# Patient Record
Sex: Male | Born: 1960 | Race: White | Hispanic: No | Marital: Married | Smoking: Current every day smoker
Health system: Southern US, Community
[De-identification: ages and names within clinical notes are randomized; demographics above are authoritative.]

## PROBLEM LIST (undated history)

## (undated) DIAGNOSIS — J449 Chronic obstructive pulmonary disease, unspecified: Secondary | ICD-10-CM

## (undated) DIAGNOSIS — I10 Essential (primary) hypertension: Secondary | ICD-10-CM

## (undated) DIAGNOSIS — N289 Disorder of kidney and ureter, unspecified: Secondary | ICD-10-CM

## (undated) DIAGNOSIS — E119 Type 2 diabetes mellitus without complications: Secondary | ICD-10-CM

## (undated) DIAGNOSIS — C801 Malignant (primary) neoplasm, unspecified: Secondary | ICD-10-CM

---

## 2018-05-24 ENCOUNTER — Encounter
Admission: EM | Disposition: A | Discharge status: Home / Self Care | Payer: Self-pay | Source: Home / Self Care | Attending: Internal Medicine

## 2018-05-24 ENCOUNTER — Emergency Department: Payer: BLUE CROSS/BLUE SHIELD

## 2018-05-24 ENCOUNTER — Observation Stay (HOSPITAL_BASED_OUTPATIENT_CLINIC_OR_DEPARTMENT_OTHER)
Admit: 2018-05-24 | Discharge: 2018-05-24 | Disposition: A | Discharge status: Home / Self Care | Payer: BLUE CROSS/BLUE SHIELD | Attending: Internal Medicine | Admitting: Internal Medicine

## 2018-05-24 ENCOUNTER — Other Ambulatory Visit: Payer: Self-pay

## 2018-05-24 ENCOUNTER — Observation Stay
Admission: EM | Admit: 2018-05-24 | Discharge: 2018-05-25 | Disposition: A | Discharge status: Home / Self Care | Payer: BLUE CROSS/BLUE SHIELD | Attending: Internal Medicine | Admitting: Internal Medicine

## 2018-05-24 DIAGNOSIS — R739 Hyperglycemia, unspecified: Secondary | ICD-10-CM

## 2018-05-24 DIAGNOSIS — K219 Gastro-esophageal reflux disease without esophagitis: Secondary | ICD-10-CM | POA: Diagnosis not present

## 2018-05-24 DIAGNOSIS — E1165 Type 2 diabetes mellitus with hyperglycemia: Secondary | ICD-10-CM | POA: Insufficient documentation

## 2018-05-24 DIAGNOSIS — Z85828 Personal history of other malignant neoplasm of skin: Secondary | ICD-10-CM | POA: Diagnosis not present

## 2018-05-24 DIAGNOSIS — E1122 Type 2 diabetes mellitus with diabetic chronic kidney disease: Secondary | ICD-10-CM | POA: Insufficient documentation

## 2018-05-24 DIAGNOSIS — J441 Chronic obstructive pulmonary disease with (acute) exacerbation: Secondary | ICD-10-CM | POA: Diagnosis present

## 2018-05-24 DIAGNOSIS — Z794 Long term (current) use of insulin: Secondary | ICD-10-CM | POA: Diagnosis not present

## 2018-05-24 DIAGNOSIS — I130 Hypertensive heart and chronic kidney disease with heart failure and stage 1 through stage 4 chronic kidney disease, or unspecified chronic kidney disease: Secondary | ICD-10-CM | POA: Diagnosis not present

## 2018-05-24 DIAGNOSIS — Z8249 Family history of ischemic heart disease and other diseases of the circulatory system: Secondary | ICD-10-CM | POA: Diagnosis not present

## 2018-05-24 DIAGNOSIS — I5030 Unspecified diastolic (congestive) heart failure: Secondary | ICD-10-CM

## 2018-05-24 DIAGNOSIS — Z9114 Patient's other noncompliance with medication regimen: Secondary | ICD-10-CM | POA: Diagnosis not present

## 2018-05-24 DIAGNOSIS — F17200 Nicotine dependence, unspecified, uncomplicated: Secondary | ICD-10-CM | POA: Insufficient documentation

## 2018-05-24 DIAGNOSIS — R0789 Other chest pain: Secondary | ICD-10-CM | POA: Insufficient documentation

## 2018-05-24 DIAGNOSIS — Z79899 Other long term (current) drug therapy: Secondary | ICD-10-CM | POA: Diagnosis not present

## 2018-05-24 DIAGNOSIS — N189 Chronic kidney disease, unspecified: Secondary | ICD-10-CM | POA: Insufficient documentation

## 2018-05-24 DIAGNOSIS — Z888 Allergy status to other drugs, medicaments and biological substances status: Secondary | ICD-10-CM | POA: Diagnosis not present

## 2018-05-24 DIAGNOSIS — Z91013 Allergy to seafood: Secondary | ICD-10-CM | POA: Insufficient documentation

## 2018-05-24 DIAGNOSIS — Z87442 Personal history of urinary calculi: Secondary | ICD-10-CM | POA: Diagnosis not present

## 2018-05-24 DIAGNOSIS — R079 Chest pain, unspecified: Secondary | ICD-10-CM

## 2018-05-24 DIAGNOSIS — E119 Type 2 diabetes mellitus without complications: Secondary | ICD-10-CM

## 2018-05-24 HISTORY — DX: Chronic obstructive pulmonary disease, unspecified: J44.9

## 2018-05-24 HISTORY — DX: Disorder of kidney and ureter, unspecified: N28.9

## 2018-05-24 HISTORY — DX: Type 2 diabetes mellitus without complications: E11.9

## 2018-05-24 HISTORY — PX: LEFT HEART CATH AND CORONARY ANGIOGRAPHY: CATH118249

## 2018-05-24 HISTORY — DX: Essential (primary) hypertension: I10

## 2018-05-24 HISTORY — DX: Malignant (primary) neoplasm, unspecified: C80.1

## 2018-05-24 LAB — COMPREHENSIVE METABOLIC PANEL
ALK PHOS: 119 U/L (ref 38–126)
ALT: 26 U/L (ref 0–44)
ANION GAP: 10 (ref 5–15)
AST: 22 U/L (ref 15–41)
Albumin: 3.6 g/dL (ref 3.5–5.0)
BILIRUBIN TOTAL: 0.6 mg/dL (ref 0.3–1.2)
BUN: 20 mg/dL (ref 6–20)
CALCIUM: 8.9 mg/dL (ref 8.9–10.3)
CO2: 22 mmol/L (ref 22–32)
CREATININE: 1.29 mg/dL — AB (ref 0.61–1.24)
Chloride: 97 mmol/L — ABNORMAL LOW (ref 98–111)
GFR calc Af Amer: >60 mL/min (ref >60)
GFR calc non Af Amer: >60 mL/min (ref >60)
GLUCOSE: 539 mg/dL — AB (ref 70–99)
Potassium: 3.8 mmol/L (ref 3.5–5.1)
SODIUM: 129 mmol/L — AB (ref 135–145)
TOTAL PROTEIN: 6.8 g/dL (ref 6.5–8.1)

## 2018-05-24 LAB — TROPONIN I
Troponin I: <0.03 ng/mL (ref <0.03)
Troponin I: 0.1 ng/mL (ref <0.03)
Troponin I: 0.48 ng/mL (ref <0.03)
Troponin I: 0.94 ng/mL (ref <0.03)

## 2018-05-24 LAB — GLUCOSE, CAPILLARY
Glucose-Capillary: 321 mg/dL — ABNORMAL HIGH (ref 70–99)
Glucose-Capillary: 417 mg/dL — ABNORMAL HIGH (ref 70–99)
Glucose-Capillary: 418 mg/dL — ABNORMAL HIGH (ref 70–99)
Glucose-Capillary: 330 mg/dL — ABNORMAL HIGH (ref 70–99)
Glucose-Capillary: 325 mg/dL — ABNORMAL HIGH (ref 70–99)
Glucose-Capillary: 509 mg/dL (ref 70–99)
Glucose-Capillary: 475 mg/dL — ABNORMAL HIGH (ref 70–99)

## 2018-05-24 LAB — CBC WITH DIFFERENTIAL/PLATELET
Abs Immature Granulocytes: 0.04 10*3/uL (ref 0.00–0.07)
Basophils Absolute: 0 10*3/uL (ref 0.0–0.1)
Basophils Relative: 0 %
EOS PCT: 2 %
Eosinophils Absolute: 0.2 10*3/uL (ref 0.0–0.5)
HEMATOCRIT: 43 % (ref 39.0–52.0)
Hemoglobin: 15.2 g/dL (ref 13.0–17.0)
Immature Granulocytes: 1 %
Lymphocytes Relative: 29 %
Lymphs Abs: 2.2 10*3/uL (ref 0.7–4.0)
MCH: 31.7 pg (ref 26.0–34.0)
MCHC: 35.3 g/dL (ref 30.0–36.0)
MCV: 89.6 fL (ref 80.0–100.0)
MONO ABS: 0.4 10*3/uL (ref 0.1–1.0)
MONOS PCT: 5 %
NRBC: 0 % (ref 0.0–0.2)
Neutro Abs: 4.8 10*3/uL (ref 1.7–7.7)
Neutrophils Relative %: 63 %
Platelets: 134 10*3/uL — ABNORMAL LOW (ref 150–400)
RBC: 4.8 MIL/uL (ref 4.22–5.81)
RDW: 13.1 % (ref 11.5–15.5)
WBC: 7.6 10*3/uL (ref 4.0–10.5)

## 2018-05-24 LAB — ECHOCARDIOGRAM COMPLETE
Height: 72 in
WEIGHTICAEL: 3712 [oz_av]

## 2018-05-24 LAB — PROTIME-INR
INR: 0.94
PROTHROMBIN TIME: 12.5 s (ref 11.4–15.2)

## 2018-05-24 LAB — TSH: TSH: 0.757 u[IU]/mL (ref 0.350–4.500)

## 2018-05-24 LAB — HEMOGLOBIN A1C
Hgb A1c MFr Bld: 12 % — ABNORMAL HIGH (ref 4.8–5.6)
Mean Plasma Glucose: 297.7 mg/dL

## 2018-05-24 LAB — APTT: APTT: 27 s (ref 24–36)

## 2018-05-24 LAB — BRAIN NATRIURETIC PEPTIDE: B Natriuretic Peptide: 11 pg/mL (ref 0.0–100.0)

## 2018-05-24 LAB — HEPARIN LEVEL (UNFRACTIONATED): Heparin Unfractionated: <0.1 IU/mL — ABNORMAL LOW (ref 0.30–0.70)

## 2018-05-24 SURGERY — LEFT HEART CATH AND CORONARY ANGIOGRAPHY
Anesthesia: Moderate Sedation

## 2018-05-24 MED ORDER — SODIUM CHLORIDE 0.9 % IV SOLN: 250.0000 mL | INTRAVENOUS | Status: DC | PRN

## 2018-05-24 MED ORDER — DIPHENHYDRAMINE HCL 50 MG/ML IJ SOLN
12.5000 mg | Freq: Once | INTRAMUSCULAR | Status: AC
  Administered 2018-05-24: 12.5 mg via INTRAVENOUS
  Filled 2018-05-24: qty 1

## 2018-05-24 MED ORDER — SUCRALFATE 1 GM/10ML PO SUSP
1.0000 g | Freq: Three times a day (TID) | ORAL | Status: DC
  Administered 2018-05-24 – 2018-05-25 (×2): 1 g via ORAL
  Filled 2018-05-24 (×5): qty 10

## 2018-05-24 MED ORDER — ALBUTEROL SULFATE (2.5 MG/3ML) 0.083% IN NEBU: 2.5000 mg | INHALATION_SOLUTION | RESPIRATORY_TRACT | Status: DC | PRN

## 2018-05-24 MED ORDER — SERTRALINE HCL 50 MG PO TABS
50.0000 mg | ORAL_TABLET | Freq: Every day | ORAL | Status: DC
  Administered 2018-05-24 – 2018-05-25 (×2): 50 mg via ORAL
  Filled 2018-05-24 (×2): qty 1

## 2018-05-24 MED ORDER — HEPARIN BOLUS VIA INFUSION
4000.0000 [IU] | Freq: Once | INTRAVENOUS | Status: AC
  Administered 2018-05-24: 4000 [IU] via INTRAVENOUS
  Filled 2018-05-24: qty 4000

## 2018-05-24 MED ORDER — METHYLPREDNISOLONE SODIUM SUCC 125 MG IJ SOLR
INTRAMUSCULAR | Status: AC
  Filled 2018-05-24: qty 2

## 2018-05-24 MED ORDER — ASPIRIN 81 MG PO CHEW
CHEWABLE_TABLET | ORAL | Status: AC
  Filled 2018-05-24: qty 1

## 2018-05-24 MED ORDER — GABAPENTIN 300 MG PO CAPS: 600.0000 mg | ORAL_CAPSULE | Freq: Three times a day (TID) | ORAL | Status: DC

## 2018-05-24 MED ORDER — ACETAMINOPHEN 325 MG PO TABS: 650.0000 mg | ORAL_TABLET | Freq: Four times a day (QID) | ORAL | Status: DC | PRN

## 2018-05-24 MED ORDER — GABAPENTIN 300 MG PO CAPS: 900.0000 mg | ORAL_CAPSULE | Freq: Every day | ORAL | Status: DC

## 2018-05-24 MED ORDER — FENTANYL CITRATE (PF) 100 MCG/2ML IJ SOLN
INTRAMUSCULAR | Status: DC | PRN
  Administered 2018-05-24: 25 ug via INTRAVENOUS

## 2018-05-24 MED ORDER — METHYLPREDNISOLONE SODIUM SUCC 125 MG IJ SOLR
125.0000 mg | Freq: Once | INTRAMUSCULAR | Status: AC
  Administered 2018-05-24: 125 mg via INTRAVENOUS
  Filled 2018-05-24: qty 2

## 2018-05-24 MED ORDER — FENTANYL CITRATE (PF) 100 MCG/2ML IJ SOLN
INTRAMUSCULAR | Status: AC
  Filled 2018-05-24: qty 2

## 2018-05-24 MED ORDER — SODIUM CHLORIDE 0.9% FLUSH: 3.0000 mL | INTRAVENOUS | Status: DC | PRN

## 2018-05-24 MED ORDER — ACETAMINOPHEN 325 MG PO TABS: 650.0000 mg | ORAL_TABLET | ORAL | Status: DC | PRN

## 2018-05-24 MED ORDER — ONDANSETRON HCL 4 MG/2ML IJ SOLN
4.0000 mg | Freq: Once | INTRAMUSCULAR | Status: AC
  Administered 2018-05-24: 4 mg via INTRAVENOUS
  Filled 2018-05-24: qty 2

## 2018-05-24 MED ORDER — HEPARIN (PORCINE) IN NACL 1000-0.9 UT/500ML-% IV SOLN
INTRAVENOUS | Status: AC
  Filled 2018-05-24: qty 1000

## 2018-05-24 MED ORDER — PANTOPRAZOLE SODIUM 40 MG PO TBEC
40.0000 mg | DELAYED_RELEASE_TABLET | Freq: Every day | ORAL | Status: DC
  Administered 2018-05-24 – 2018-05-25 (×2): 40 mg via ORAL
  Filled 2018-05-24 (×2): qty 1

## 2018-05-24 MED ORDER — IPRATROPIUM-ALBUTEROL 0.5-2.5 (3) MG/3ML IN SOLN
3.0000 mL | Freq: Once | RESPIRATORY_TRACT | Status: AC
  Administered 2018-05-24: 3 mL via RESPIRATORY_TRACT
  Filled 2018-05-24: qty 3

## 2018-05-24 MED ORDER — INSULIN REGULAR HUMAN 100 UNIT/ML IJ SOLN
15.0000 [IU] | Freq: Once | INTRAMUSCULAR | Status: AC
  Administered 2018-05-24: 15 [IU] via INTRAVENOUS
  Filled 2018-05-24: qty 10

## 2018-05-24 MED ORDER — HEPARIN SODIUM (PORCINE) 5000 UNIT/ML IJ SOLN
5000.0000 [IU] | Freq: Three times a day (TID) | INTRAMUSCULAR | Status: DC
  Administered 2018-05-24: 5000 [IU] via SUBCUTANEOUS
  Filled 2018-05-24 (×3): qty 1

## 2018-05-24 MED ORDER — INSULIN ASPART 100 UNIT/ML ~~LOC~~ SOLN: 0.0000 [IU] | Freq: Every day | SUBCUTANEOUS | Status: DC

## 2018-05-24 MED ORDER — INSULIN ASPART 100 UNIT/ML ~~LOC~~ SOLN
0.0000 [IU] | Freq: Three times a day (TID) | SUBCUTANEOUS | Status: DC
  Administered 2018-05-24: 7 [IU] via SUBCUTANEOUS
  Filled 2018-05-24: qty 1

## 2018-05-24 MED ORDER — SODIUM CHLORIDE 0.9% FLUSH
3.0000 mL | Freq: Two times a day (BID) | INTRAVENOUS | Status: DC
  Administered 2018-05-24 – 2018-05-25 (×2): 3 mL via INTRAVENOUS

## 2018-05-24 MED ORDER — GABAPENTIN 300 MG PO CAPS
900.0000 mg | ORAL_CAPSULE | Freq: Every day | ORAL | Status: DC
  Administered 2018-05-24: 900 mg via ORAL
  Filled 2018-05-24: qty 3

## 2018-05-24 MED ORDER — CHLORTHALIDONE 25 MG PO TABS
12.5000 mg | ORAL_TABLET | Freq: Every morning | ORAL | Status: DC
  Administered 2018-05-24 – 2018-05-25 (×2): 12.5 mg via ORAL
  Filled 2018-05-24 (×3): qty 0.5

## 2018-05-24 MED ORDER — SODIUM CHLORIDE 0.9% FLUSH: 3.0000 mL | Freq: Two times a day (BID) | INTRAVENOUS | Status: DC

## 2018-05-24 MED ORDER — SODIUM CHLORIDE 0.9 % IV BOLUS
1000.0000 mL | Freq: Once | INTRAVENOUS | Status: AC
  Administered 2018-05-24: 1000 mL via INTRAVENOUS

## 2018-05-24 MED ORDER — INSULIN GLARGINE 100 UNIT/ML ~~LOC~~ SOLN
18.0000 [IU] | Freq: Every day | SUBCUTANEOUS | Status: DC
  Administered 2018-05-24: 18 [IU] via SUBCUTANEOUS
  Filled 2018-05-24 (×2): qty 0.18

## 2018-05-24 MED ORDER — ASPIRIN 81 MG PO CHEW
81.0000 mg | CHEWABLE_TABLET | ORAL | Status: AC
  Administered 2018-05-24: 81 mg via ORAL

## 2018-05-24 MED ORDER — SODIUM CHLORIDE 0.9 % WEIGHT BASED INFUSION: 1.0000 mL/kg/h | INTRAVENOUS | Status: DC

## 2018-05-24 MED ORDER — MIDAZOLAM HCL 2 MG/2ML IJ SOLN
INTRAMUSCULAR | Status: AC
  Filled 2018-05-24: qty 2

## 2018-05-24 MED ORDER — ONDANSETRON HCL 4 MG PO TABS
4.0000 mg | ORAL_TABLET | Freq: Four times a day (QID) | ORAL | Status: DC | PRN
  Filled 2018-05-24: qty 1

## 2018-05-24 MED ORDER — ASPIRIN 81 MG PO CHEW
324.0000 mg | CHEWABLE_TABLET | Freq: Once | ORAL | Status: DC
  Filled 2018-05-24 (×2): qty 4

## 2018-05-24 MED ORDER — SODIUM CHLORIDE 0.9 % WEIGHT BASED INFUSION
3.0000 mL/kg/h | INTRAVENOUS | Status: DC
  Administered 2018-05-24: 3 mL/kg/h via INTRAVENOUS

## 2018-05-24 MED ORDER — DIPHENHYDRAMINE HCL 50 MG/ML IJ SOLN
INTRAMUSCULAR | Status: AC
  Filled 2018-05-24: qty 1

## 2018-05-24 MED ORDER — LIVING WELL WITH DIABETES BOOK
Freq: Once | Status: DC
  Filled 2018-05-24: qty 1

## 2018-05-24 MED ORDER — GABAPENTIN 600 MG PO TABS
600.0000 mg | ORAL_TABLET | Freq: Two times a day (BID) | ORAL | Status: DC
  Administered 2018-05-24 – 2018-05-25 (×3): 600 mg via ORAL
  Filled 2018-05-24 (×4): qty 1

## 2018-05-24 MED ORDER — NITROGLYCERIN 2 % TD OINT
1.0000 [in_us] | TOPICAL_OINTMENT | Freq: Once | TRANSDERMAL | Status: AC
  Administered 2018-05-24: 1 [in_us] via TOPICAL
  Filled 2018-05-24: qty 1

## 2018-05-24 MED ORDER — HEPARIN BOLUS VIA INFUSION
3000.0000 [IU] | Freq: Once | INTRAVENOUS | Status: DC
  Filled 2018-05-24: qty 3000

## 2018-05-24 MED ORDER — IOPAMIDOL (ISOVUE-300) INJECTION 61%
INTRAVENOUS | Status: DC | PRN
  Administered 2018-05-24: 130 mL via INTRA_ARTERIAL

## 2018-05-24 MED ORDER — ACETAMINOPHEN 650 MG RE SUPP
650.0000 mg | Freq: Four times a day (QID) | RECTAL | Status: DC | PRN
  Filled 2018-05-24 (×2): qty 1

## 2018-05-24 MED ORDER — HEPARIN (PORCINE) IN NACL 100-0.45 UNIT/ML-% IJ SOLN
1650.0000 [IU]/h | INTRAMUSCULAR | Status: DC
  Administered 2018-05-24: 1250 [IU]/h via INTRAVENOUS
  Filled 2018-05-24: qty 250

## 2018-05-24 MED ORDER — DOCUSATE SODIUM 100 MG PO CAPS
100.0000 mg | ORAL_CAPSULE | Freq: Two times a day (BID) | ORAL | Status: DC
  Administered 2018-05-24: 100 mg via ORAL
  Filled 2018-05-24 (×3): qty 1

## 2018-05-24 MED ORDER — MIDAZOLAM HCL 2 MG/2ML IJ SOLN
INTRAMUSCULAR | Status: DC | PRN
  Administered 2018-05-24: 1 mg via INTRAVENOUS

## 2018-05-24 MED ORDER — LISINOPRIL 20 MG PO TABS
40.0000 mg | ORAL_TABLET | Freq: Every day | ORAL | Status: DC
  Administered 2018-05-24 – 2018-05-25 (×2): 40 mg via ORAL
  Filled 2018-05-24: qty 4
  Filled 2018-05-24: qty 2

## 2018-05-24 MED ORDER — INSULIN ASPART 100 UNIT/ML ~~LOC~~ SOLN
10.0000 [IU] | Freq: Once | SUBCUTANEOUS | Status: AC
  Administered 2018-05-24: 10 [IU] via SUBCUTANEOUS
  Filled 2018-05-24: qty 1

## 2018-05-24 MED ORDER — INSULIN ASPART 100 UNIT/ML ~~LOC~~ SOLN
8.0000 [IU] | Freq: Once | SUBCUTANEOUS | Status: AC
  Administered 2018-05-24: 8 [IU] via SUBCUTANEOUS
  Filled 2018-05-24: qty 1

## 2018-05-24 MED ORDER — INSULIN ASPART 100 UNIT/ML ~~LOC~~ SOLN
0.0000 [IU] | Freq: Three times a day (TID) | SUBCUTANEOUS | Status: DC
  Administered 2018-05-25: 8 [IU] via SUBCUTANEOUS
  Administered 2018-05-25: 11 [IU] via SUBCUTANEOUS
  Filled 2018-05-24 (×2): qty 1

## 2018-05-24 MED ORDER — SODIUM CHLORIDE 0.9 % WEIGHT BASED INFUSION
1.0000 mL/kg/h | INTRAVENOUS | Status: AC
  Administered 2018-05-24 – 2018-05-25 (×2): 1 mL/kg/h via INTRAVENOUS

## 2018-05-24 MED ORDER — INSULIN ASPART 100 UNIT/ML ~~LOC~~ SOLN
12.0000 [IU] | SUBCUTANEOUS | Status: AC
  Administered 2018-05-24: 12 [IU] via SUBCUTANEOUS
  Filled 2018-05-24: qty 1

## 2018-05-24 MED ORDER — MORPHINE SULFATE (PF) 4 MG/ML IV SOLN
4.0000 mg | Freq: Once | INTRAVENOUS | Status: AC
  Administered 2018-05-24: 4 mg via INTRAVENOUS
  Filled 2018-05-24: qty 1

## 2018-05-24 MED ORDER — ONDANSETRON HCL 4 MG/2ML IJ SOLN: 4.0000 mg | Freq: Four times a day (QID) | INTRAMUSCULAR | Status: DC | PRN

## 2018-05-24 MED ORDER — AMLODIPINE BESYLATE 10 MG PO TABS
10.0000 mg | ORAL_TABLET | ORAL | Status: DC
  Administered 2018-05-25: 10 mg via ORAL
  Filled 2018-05-24: qty 1
  Filled 2018-05-24: qty 2

## 2018-05-24 SURGICAL SUPPLY — 10 items
CATH INFINITI 5FR ANG PIGTAIL (CATHETERS) ×3 IMPLANT
CATH INFINITI 5FR JL4 (CATHETERS) ×3 IMPLANT
CATH INFINITI JR4 5F (CATHETERS) ×3 IMPLANT
DEVICE CLOSURE MYNXGRIP 5F (Vascular Products) ×3 IMPLANT
DEVICE SAFEGUARD 24CM (GAUZE/BANDAGES/DRESSINGS) ×3 IMPLANT
KIT MANI 3VAL PERCEP (MISCELLANEOUS) ×3 IMPLANT
NEEDLE PERC 18GX7CM (NEEDLE) ×3 IMPLANT
PACK CARDIAC CATH (CUSTOM PROCEDURE TRAY) ×3 IMPLANT
SHEATH AVANTI 5FR X 11CM (SHEATH) ×3 IMPLANT
WIRE GUIDERIGHT .035X150 (WIRE) ×3 IMPLANT

## 2018-05-24 NOTE — ED Triage Notes (Signed)
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  Pt is alert and oriented x 4. Pt has Hx of COPD. Pt was given a bag NaCl and 3 Nitros en route to hospital pain went from 10/10 to a 2/10. Pt has complaints of left arm pain from shoulder to elbow.  12-Lead per EMS showed sinus tach. VS per EMS O2sat-96%RA but EMS placed pt on 2L nasal cannula. Bs-547 then dropped to 407. EMS placed a 20 gauge in left hand. Pt medications at bedside.Pt has a shellfish allergy. e

## 2018-05-24 NOTE — Progress Notes (Addendum)
ANTICOAGULATION CONSULT NOTE - Initial Consult  Pharmacy Consult for Heparin  Indication: chest pain/ACS  Allergies  Allergen Reactions  . Shellfish Allergy     Patient Measurements: Height: 6' (182.9 cm) Weight: 232 lb (105.2 kg) IBW/kg (Calculated) : 77.6 Heparin Dosing Weight: 99.4 kg   Vital Signs: Temp: 98.7 F (37.1 C) (10/17 1937) Temp Source: Oral (10/17 1937) BP: 110/77 (10/17 1937) Pulse Rate: 98 (10/17 1937)  Labs: Recent Labs    05/24/18 0259 05/24/18 0542 05/24/18 1150 05/24/18 1314 05/24/18 1747 05/24/18 2003  HGB 15.2  --   --   --   --   --   HCT 43.0  --   --   --   --   --   PLT 134*  --   --   --   --   --   APTT  --   --   --  27  --   --   LABPROT  --   --   --  12.5  --   --   INR  --   --   --  0.94  --   --   HEPARINUNFRC  --   --   --   --   --  <0.10*  CREATININE 1.29*  --   --   --   --   --   TROPONINI <0.03 0.10* 0.48*  --  0.94*  --     Estimated Creatinine Clearance: 80.1 mL/min (A) (by C-G formula based on SCr of 1.29 mg/dL (H)).   Medical History: Past Medical History:  Diagnosis Date  . Cancer (HCC)    Hx of skin cancer  . COPD (chronic obstructive pulmonary disease) ([place])   . Diabetes mellitus without complication ([place])   . Hypertension   . Renal disorder    kidney stones    Medications:  Medications Prior to Admission  Medication Sig Dispense Refill Last Dose  . albuterol (PROVENTIL HFA;VENTOLIN HFA) 108 (90 Base) MCG/ACT inhaler Inhale 2 puffs into the lungs every 4 (four) hours as needed.   prn at prn  . amLODipine (NORVASC) 10 MG tablet Take 10 mg by mouth every morning.  11 05/23/2018 at Unknown time  . chlorthalidone (HYGROTON) 25 MG tablet Take 12.5 mg by mouth every morning.  6 05/23/2018 at Unknown time  . gabapentin (NEURONTIN) 300 MG capsule Take 2-3 capsules by mouth 3 (three) times daily. 600 mg qam 600 mg noon 900 mg qhs  6 05/23/2018 at Unknown time  . glipiZIDE (GLUCOTROL) 10 MG tablet Take 10 mg by  mouth daily.  0 05/23/2018 at Unknown time  . Ipratropium-Albuterol (COMBIVENT) 20-100 MCG/ACT AERS respimat Inhale 1 puff into the lungs 4 (four) times daily.   05/23/2018 at Unknown time  . lisinopril (PRINIVIL,ZESTRIL) 40 MG tablet Take 40 mg by mouth daily.  3 05/23/2018 at Unknown time  . metFORMIN (GLUCOPHAGE) 1000 MG tablet Take 1,000 mg by mouth 2 (two) times daily with a meal.  0 05/23/2018 at Unknown time  . omeprazole (PRILOSEC) 20 MG capsule Take 1 capsule by mouth daily.  1 05/23/2018 at Unknown time  . sertraline (ZOLOFT) 100 MG tablet Take 50 mg by mouth daily.  1 05/23/2018 at Unknown time   Assessment: 57 y.o.malePMH of DM and HTN. Troponin is elevated at 0.48 and increasing consistent with NSTEMI. He is on no anticoagulants PTA. Baseline labs have been drawn and are pending  Goal of Therapy:  Heparin level 0.3-0.7  units/ml Monitor platelets by anticoagulation protocol: Yes   Plan:  Give 4000 units bolus x 1 Start heparin infusion at 1250 units/hr Check anti-Xa level in 6 hours and daily while on heparin Continue to monitor H&H and platelets  10/17:  HL @ 20:00 = < 0.1 RN says pt had cath today and is no longer on heparin.   Will not continue heparin.   Alaze Garverick D 05/24/2018,10:49 PM

## 2018-05-24 NOTE — ED Provider Notes (Signed)
[facility] Emergency Department Provider Note   ____________________________________________   First MD Initiated Contact with Patient 05/24/18 0303     (approximate)  I have reviewed the triage vital signs and the nursing notes.   HISTORY  Chief Complaint Chest pain   HPI Brandon Johnston is a 57 y.o. male brought to the ED from work via EMS with a chief complaint of chest pain.  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  Pain relieved with 3 nitroglycerin in route to the ED.  Patient denies recent fever, chills, shortness of breath, abdominal pain, nausea or vomiting.  Denies recent travel or trauma.   Past Medical History:  Diagnosis Date  . Cancer (HCC)    Hx of skin cancer  . COPD (chronic obstructive pulmonary disease) ([place])   . Diabetes mellitus without complication ([place])   . Hypertension   . Renal disorder    kidney stones    There are no active problems to display for this patient.   History reviewed. No pertinent surgical history.  Prior to Admission medications   Not on File    Allergies Shellfish allergy  History reviewed. No pertinent family history.  Social History Social History   Tobacco Use  . Smoking status: Current Every Day Smoker  . Smokeless tobacco: Never Used  Substance Use Topics  . Alcohol use: Never    Frequency: Never  . Drug use: Never    Review of Systems  Constitutional: No fever/chills Eyes: No visual changes. ENT: No sore throat. Cardiovascular: Positive for chest pain. Respiratory: Denies shortness of breath. Gastrointestinal: No abdominal pain.  No nausea, no vomiting.  No diarrhea.  No constipation. Genitourinary: Negative for dysuria. Musculoskeletal: Negative for back pain. Skin: Negative for rash. Neurological: Negative for headaches,  focal weakness or numbness.   ____________________________________________   PHYSICAL EXAM:  VITAL SIGNS: ED Triage Vitals  Enc Vitals Group     BP 05/24/18 0247 120/78     Pulse Rate 05/24/18 0247 (!) 114     Resp 05/24/18 0247 (!) 31     Temp 05/24/18 0247 98 F (36.7 C)     Temp Source 05/24/18 0247 Oral     SpO2 05/24/18 0247 94 %     Weight 05/24/18 0250 232 lb (105.2 kg)     Height 05/24/18 0250 6' (1.829 m)     Head Circumference --      Peak Flow --      Pain Score 05/24/18 0249 2     Pain Loc --      Pain Edu? --      Excl. in [place]? --     Constitutional: Alert and oriented. Well appearing and in mild to moderate acute distress. Eyes: Conjunctivae are normal. PERRL. EOMI. Head: Atraumatic. Nose: No congestion/rhinnorhea. Mouth/Throat: Mucous membranes are moist.  Oropharynx non-erythematous. Neck: No stridor.   Cardiovascular: Tachycardic rate, regular rhythm. Grossly normal heart sounds.  Good peripheral circulation. Respiratory: Increased respiratory effort.  No retractions. Lungs with scattered wheezing and bibasilar rales. Gastrointestinal: Soft and nontender. No distention. No abdominal bruits. No CVA tenderness. Musculoskeletal: No lower extremity tenderness nor edema.  No joint effusions. Neurologic:  Normal speech and language. No gross focal neurologic deficits are appreciated.  Skin:  Skin is warm, dry and intact. No rash noted. Psychiatric: Mood  and affect are normal. Speech and behavior are normal.  ____________________________________________   LABS (all labs ordered are listed, but only abnormal results are displayed)  Labs Reviewed  COMPREHENSIVE METABOLIC PANEL - Abnormal; Notable for the following components:      Result Value   Sodium 129 (*)    Chloride 97 (*)    Glucose, Bld 539 (*)    Creatinine, Ser 1.29 (*)    All other components within normal limits  BRAIN NATRIURETIC PEPTIDE  TROPONIN I  CBC WITH DIFFERENTIAL/PLATELET  CBC  WITH DIFFERENTIAL/PLATELET   ____________________________________________  EKG  ED ECG REPORT I, SUNG,JADE J, the attending physician, personally viewed and interpreted this ECG.   Date: 05/24/2018  EKG Time: 0241  Rate: 115  Rhythm: sinus tachycardia  Axis: RAD  Intervals:none  ST&T Change: Nonspecific  ____________________________________________  RADIOLOGY  ED MD interpretation: No acute cardiopulmonary process  Official radiology report(s): Dg Chest Port 1 View  Result Date: 05/24/2018 CLINICAL DATA:  57 y/o  M; central to left-sided chest pain. EXAM: PORTABLE CHEST 1 VIEW COMPARISON:  None. FINDINGS: The heart size and mediastinal contours are within normal limits. Both lungs are clear. The visualized skeletal structures are unremarkable. IMPRESSION: No acute pulmonary process identified. Electronically Signed   By: Kristine Garbe M.D.   On: 05/24/2018 03:33    ____________________________________________   PROCEDURES  Procedure(s) performed: None  Procedures  Critical Care performed: Yes, see critical care note(s)  CRITICAL CARE Performed by: Paulette Blanch   Total critical care time: 45 minutes  Critical care time was exclusive of separately billable procedures and treating other patients.  Critical care was necessary to treat or prevent imminent or life-threatening deterioration.  Critical care was time spent personally by me on the following activities: development of treatment plan with patient and/or surrogate as well as nursing, discussions with consultants, evaluation of patient's response to treatment, examination of patient, obtaining history from patient or surrogate, ordering and performing treatments and interventions, ordering and review of laboratory studies, ordering and review of radiographic studies, pulse oximetry and re-evaluation of patient's condition. ____________________________________________   INITIAL IMPRESSION /  ASSESSMENT AND PLAN / ED COURSE  As part of my medical decision making, I reviewed the following data within the [place] notes reviewed and incorporated, Labs reviewed, EKG interpreted, Old chart reviewed, Radiograph reviewed and Notes from prior ED visits   58 year old male with COPD, hypertension and diabetes who presents with chest pain.  Wheezing and rales heard on examination. Differential includes, but is not limited to, viral syndrome, bronchitis including COPD exacerbation, pneumonia, reactive airway disease including asthma, CHF including exacerbation with or without pulmonary/interstitial edema, pneumothorax, ACS, thoracic trauma, and pulmonary embolism.  Will administer DuoNeb treatment, 125 mg IV Solu-Medrol, 20 mg IV Lasix.  Clinically patient has a mixed picture of COPD and CHF.  Anticipate admission.  Clinical Course as of May 24 425  Thu May 24, 2018  0423 Noted hyperglycemia without anion gap.  Chest pain returning.  Will administer morphine.  Will discuss with hospitalist to evaluate patient in the emergency department for admission.   [JS]    Clinical Course User Index [JS] Paulette Blanch, MD     ____________________________________________   FINAL CLINICAL IMPRESSION(S) / ED DIAGNOSES  Final diagnoses:  Chest pain, unspecified type  Hyperglycemia  Type 2 diabetes mellitus without complication, unspecified whether long term insulin use (HCC)  COPD with acute exacerbation [facility])     ED Discharge Orders  None       Note:  This document was prepared using Dragon voice recognition software and may include unintentional dictation errors.    Paulette Blanch, MD 05/24/18 [phone]

## 2018-05-24 NOTE — ED Notes (Signed)
Paged admitting MD regarding patient CBG check of 417 per sliding scale order. Will await return phone call.

## 2018-05-24 NOTE — Consult Note (Signed)
[facility] Consultation Note  Patient ID: Brandon Johnston, MRN: [id number], DOB/AGE: [id number] 56 y.o. Admit date: 05/24/2018   Date of Consult: 05/24/2018 Primary Physician: System, Pcp Not In Primary Cardiologist: None  Chief Complaint: No chief complaint on file.  Reason for Consult: Chest pain  HPI: 57 y.o. male with known tobacco abuse diabetes with complication essential hypertension mixed hyperlipidemia on appropriate medication management in the recent past.  The patient has done fairly well with this medication management but continues to smoke.  Recently he has had more shortness of breath with physical activity but woke up last night with left upper chest discomfort radiating into his upper troller and back.  This lasted for quite some time and the patient had some relief in the emergency room with morphine.  Also other medication management has helped.  Currently he is stable and feeling slightly better.  Troponin has been elevated at 0.48 most consistent with a non-ST elevation myocardial infarction  Past Medical History:  Diagnosis Date  . Cancer (HCC)    Hx of skin cancer  . COPD (chronic obstructive pulmonary disease) ([place])   . Diabetes mellitus without complication ([place])   . Hypertension   . Renal disorder    kidney stones      Surgical History: History reviewed. No pertinent surgical history.   Home Meds: Prior to Admission medications   Medication Sig Start Date End Date Taking? Authorizing Provider  albuterol (PROVENTIL HFA;VENTOLIN HFA) 108 (90 Base) MCG/ACT inhaler Inhale 2 puffs into the lungs every 4 (four) hours as needed. 10/28/16  Yes [provider]  amLODipine (NORVASC) 10 MG tablet Take 10 mg by mouth every morning. 03/18/18  Yes [provider]  chlorthalidone (HYGROTON) 25 MG tablet Take 12.5 mg by mouth every morning. 05/17/18  Yes [provider]  gabapentin (NEURONTIN) 300 MG capsule Take 2-3 capsules by  mouth 3 (three) times daily. 600 mg qam 600 mg noon 900 mg qhs 05/13/18  Yes [provider]  glipiZIDE (GLUCOTROL) 10 MG tablet Take 10 mg by mouth daily. 03/23/18  Yes [provider]  Ipratropium-Albuterol (COMBIVENT) 20-100 MCG/ACT AERS respimat Inhale 1 puff into the lungs 4 (four) times daily. 06/02/17  Yes [provider]  lisinopril (PRINIVIL,ZESTRIL) 40 MG tablet Take 40 mg by mouth daily. 04/23/18  Yes [provider]  metFORMIN (GLUCOPHAGE) 1000 MG tablet Take 1,000 mg by mouth 2 (two) times daily with a meal. 05/04/18  Yes [provider]  omeprazole (PRILOSEC) 20 MG capsule Take 1 capsule by mouth daily. 04/30/18  Yes [provider]  sertraline (ZOLOFT) 100 MG tablet Take 50 mg by mouth daily. 05/17/18  Yes [provider]    Inpatient Medications:  . amLODipine  10 mg Oral BH-[id number]  . aspirin  324 mg Oral Once  . chlorthalidone  12.5 mg Oral q morning - 10a  . docusate sodium  100 mg Oral BID  . gabapentin  900 mg Oral QHS  . gabapentin  600 mg Oral BID  . heparin  4,000 Units Intravenous Once  . insulin aspart  0-5 Units Subcutaneous QHS  . insulin aspart  0-9 Units Subcutaneous TID WC  . insulin glargine  18 Units Subcutaneous QHS  . lisinopril  40 mg Oral Daily  . living well with diabetes book   Does not apply Once  . pantoprazole  40 mg Oral Daily  . sertraline  50 mg Oral Daily  . sodium chloride flush  3 mL Intravenous Q12H   . heparin      Allergies:  Allergies  Allergen Reactions  . Shellfish Allergy     Social History   Socioeconomic History  . Marital status: Married    Spouse name: Not on file  . Number of children: Not on file  . Years of education: Not on file  . Highest education level: Not on file  Occupational History  . Not on file  Social Needs  . Financial resource strain: Not on file  . Food insecurity:    Worry: Not on file    Inability: Not on file  . Transportation needs:     Medical: Not on file    Non-medical: Not on file  Tobacco Use  . Smoking status: Current Every Day Smoker  . Smokeless tobacco: Never Used  Substance and Sexual Activity  . Alcohol use: Never    Frequency: Never  . Drug use: Never  . Sexual activity: Not on file  Lifestyle  . Physical activity:    Days per week: Not on file    Minutes per session: Not on file  . Stress: Not on file  Relationships  . Social connections:    Talks on phone: Not on file    Gets together: Not on file    Attends religious service: Not on file    Active member of club or organization: Not on file    Attends meetings of clubs or organizations: Not on file    Relationship status: Not on file  . Intimate partner violence:    Fear of current or ex partner: Not on file    Emotionally abused: Not on file    Physically abused: Not on file    Forced sexual activity: Not on file  Other Topics Concern  . Not on file  Social History Narrative  . Not on file     Family History  Problem Relation Age of Onset  . CAD Father   . CAD Maternal Grandfather   . CAD Paternal Grandfather      Review of Systems Positive for chest pain Negative for: General:  chills, fever, night sweats or weight changes.  Cardiovascular: PND orthopnea syncope dizziness  Dermatological skin lesions rashes Respiratory: Cough congestion Urologic: Frequent urination urination at night and hematuria Abdominal: negative for nausea, vomiting, diarrhea, bright red blood per rectum, melena, or hematemesis Neurologic: negative for visual changes, and/or hearing changes  All other systems reviewed and are otherwise negative except as noted above.  Labs: Recent Labs    05/24/18 0259 05/24/18 0542 05/24/18 1150  TROPONINI <0.03 0.10* 0.48*   Lab Results  Component Value Date   WBC 7.6 05/24/2018   HGB 15.2 05/24/2018   HCT 43.0 05/24/2018   MCV 89.6 05/24/2018   PLT 134 (L) 05/24/2018    Recent Labs  Lab 05/24/18 0259   NA 129*  K 3.8  CL 97*  CO2 22  BUN 20  CREATININE 1.29*  CALCIUM 8.9  PROT 6.8  BILITOT 0.6  ALKPHOS 119  ALT 26  AST 22  GLUCOSE 539*   No results found for: CHOL, HDL, LDLCALC, TRIG No results found for: DDIMER  Radiology/Studies:  Dg Chest Port 1 View  Result Date: 05/24/2018 CLINICAL DATA:  57 y/o  M; central to left-sided chest pain. EXAM: PORTABLE CHEST 1 VIEW COMPARISON:  None. FINDINGS: The heart size and mediastinal contours are within normal limits. Both lungs are clear. The visualized skeletal structures are  unremarkable. IMPRESSION: No acute pulmonary process identified. Electronically Signed   By: Kristine Garbe M.D.   On: 05/24/2018 03:33    EKG: Normal sinus rhythm  Weights: Filed Weights   05/24/18 0250  Weight: 105.2 kg     Physical Exam: Blood pressure (!) 136/94, pulse 92, temperature 98.3 F (36.8 C), temperature source Oral, resp. rate 18, height 6' (1.829 m), weight 105.2 kg, SpO2 92 %. Body mass index is 31.46 kg/m. General: Well developed, well nourished, in no acute distress. Head eyes ears nose throat: Normocephalic, atraumatic, sclera non-icteric, no xanthomas, nares are without discharge. No apparent thyromegaly and/or mass  Lungs: Normal respiratory effort.  no wheezes, no rales, no rhonchi.  Heart: RRR with normal S1 S2. no murmur gallop, no rub, PMI is normal size and placement, carotid upstroke normal without bruit, jugular venous pressure is normal Abdomen: Soft, non-tender, non-distended with normoactive bowel sounds. No hepatomegaly. No rebound/guarding. No obvious abdominal masses. Abdominal aorta is normal size without bruit Extremities: No edema. no cyanosis, no clubbing, no ulcers  Peripheral : 2+ bilateral upper extremity pulses, 2+ bilateral femoral pulses, 2+ bilateral dorsal pedal pulse Neuro: Alert and oriented. No facial asymmetry. No focal deficit. Moves all extremities spontaneously. Musculoskeletal: Normal  muscle tone without kyphosis Psych:  Responds to questions appropriately with a normal affect.    Assessment: 57 year old male with diabetes hypertension hyperlipidemia tobacco abuse with acute chest pain consistent with non-ST elevation myocardial infarction  Plan: 1.  Continue medication management for further risk reduction cardiovascular event including antihypertensive medication management 2.  Heparin for further risk reduction and treatment of myocardial infarction 3.  Proceed to cardiac catheterization to assess coronary anatomy and further treatment thereof is necessary.  Patient understands risk and benefits of cardiac catheterization.  This includes a possibility of death stroke heart attack infection bleeding or blood clot.  He is at low risk for conscious sedation  Signed, Corey Skains M.D. [facility] 05/24/2018, 12:56 PM

## 2018-05-24 NOTE — ED Notes (Signed)
Date and time results received: 05/24/18 0413 (use smartphrase ".now" to insert current time)  Test: glucose Critical Value: 539  Name of Provider Notified: Dr. Beather Arbour  Orders Received? Or Actions Taken?:

## 2018-05-24 NOTE — Progress Notes (Signed)
[facility] Encounter Note  Patient: Brandon Johnston / Admit Date: 05/24/2018 / Date of Encounter: 05/24/2018, 5:19 PM   Subjective: Patient had full resolution of chest pain.  No EKG changes.  Slight elevation of troponin And catheterization showing normal LV systolic function with ejection fraction of 55% Normal coronary arteries without evidence of stenosis  Review of Systems: Positive for: Pain Negative for: Vision change, hearing change, syncope, dizziness, nausea, vomiting,diarrhea, bloody stool, stomach pain, cough, congestion, diaphoresis, urinary frequency, urinary pain,skin lesions, skin rashes Others previously listed  Objective: Telemetry: Sinus rhythm Physical Exam: Blood pressure 127/80, pulse 83, temperature 97.8 F (36.6 C), temperature source Oral, resp. rate 17, height 6' (1.829 m), weight 105.2 kg, SpO2 (!) 87 %. Body mass index is 31.47 kg/m. General: Well developed, well nourished, in no acute distress. Head: Normocephalic, atraumatic, sclera non-icteric, no xanthomas, nares are without discharge. Neck: No apparent masses Lungs: Normal respirations with no wheezes, no rhonchi, no rales , no crackles   Heart: Regular rate and rhythm, normal S1 S2, no murmur, no rub, no gallop, PMI is normal size and placement, carotid upstroke normal without bruit, jugular venous pressure normal Abdomen: Soft, non-tender, non-distended with normoactive bowel sounds. No hepatosplenomegaly. Abdominal aorta is normal size without bruit Extremities: No edema, no clubbing, no cyanosis, no ulcers,  Peripheral: 2+ radial, 2+ femoral, 2+ dorsal pedal pulses Neuro: Alert and oriented. Moves all extremities spontaneously. Psych:  Responds to questions appropriately with a normal affect.   Intake/Output Summary (Last 24 hours) at 05/24/2018 1719 Last data filed at 05/24/2018 1700 Gross per 24 hour  Intake 60 ml  Output 200 ml  Net -140 ml    Inpatient  Medications:  . amLODipine  10 mg Oral BH-q7a  . aspirin      . aspirin  324 mg Oral Once  . chlorthalidone  12.5 mg Oral q morning - 10a  . diphenhydrAMINE      . docusate sodium  100 mg Oral BID  . gabapentin  900 mg Oral QHS  . gabapentin  600 mg Oral BID  . insulin aspart  0-5 Units Subcutaneous QHS  . insulin aspart  0-9 Units Subcutaneous TID WC  . insulin glargine  18 Units Subcutaneous QHS  . lisinopril  40 mg Oral Daily  . living well with diabetes book   Does not apply Once  . methylPREDNISolone sodium succinate      . pantoprazole  40 mg Oral Daily  . sertraline  50 mg Oral Daily  . sodium chloride flush  3 mL Intravenous Q12H   Infusions:  . heparin Stopped (05/24/18 1530)    Labs: Recent Labs    05/24/18 0259  NA 129*  K 3.8  CL 97*  CO2 22  GLUCOSE 539*  BUN 20  CREATININE 1.29*  CALCIUM 8.9   Recent Labs    05/24/18 0259  AST 22  ALT 26  ALKPHOS 119  BILITOT 0.6  PROT 6.8  ALBUMIN 3.6   Recent Labs    05/24/18 0259  WBC 7.6  NEUTROABS 4.8  HGB 15.2  HCT 43.0  MCV 89.6  PLT 134*   Recent Labs    05/24/18 0259 05/24/18 0542 05/24/18 1150  TROPONINI <0.03 0.10* 0.48*   Invalid input(s): POCBNP Recent Labs    05/24/18 0542  HGBA1C 12.0*     Weights: Filed Weights   05/24/18 0250 05/24/18 1521  Weight: 105.2 kg 105.2 kg     Radiology/Studies:  Dg Chest Port 1 View  Result Date: 05/24/2018 CLINICAL DATA:  57 y/o  M; central to left-sided chest pain. EXAM: PORTABLE CHEST 1 VIEW COMPARISON:  None. FINDINGS: The heart size and mediastinal contours are within normal limits. Both lungs are clear. The visualized skeletal structures are unremarkable. IMPRESSION: No acute pulmonary process identified. Electronically Signed   By: Kristine Garbe M.D.   On: 05/24/2018 03:33     Assessment and Recommendation  57 y.o. male with diabetes hypertension hyperlipidemia tobacco abuse with acute episode of chest pain with minimal  elevation of troponin and no EKG changes with subsequent cardiac catheterization showing normal coronary arteries and normal LV systolic function 1.  No further cardiac intervention or diagnostics necessary at this time 2.  Continue risk factor modification for cardiovascular disease risk reduction including antihypertensives, high intensity cholesterol therapy, and treatment for diabetes 3.  Patient was counseled on the discontinuation of tobacco abuse 4.  Begin ambulation and follow for improvements of symptoms and other potential causes 5.  If ambulating well with any further significant symptoms okay for discharge home from cardiac standpoint  Signed, Serafina Royals M.D. FACC

## 2018-05-24 NOTE — H&P (Signed)
Brandon Johnston is an 57 y.o. male.   Chief Complaint: Chest pain HPI: The patient with past medical history of diabetes, hypertension and COPD presents to the emergency department complaining of chest pain.  The patient's pain began while he was sitting at work.  He does not have a strenuous job.  He began to have very labored breathing as well as substernal chest pain.  It did not radiate.  The patient denies nausea, vomiting or diaphoresis.  He was wheezing upon EMS arrival area thus they administered Solu-Medrol as well as his breathing treatments.  Patient also had nitroglycerin and had Nitropaste applied in the emergency department which has relieved his chest pain tremendously.  It lingers some.  Due to ongoing chest pain and multiple cardiac risk factors the emergency department staff called the hospitalist service for further evaluation.  Past Medical History:  Diagnosis Date  . Cancer (HCC)    Hx of skin cancer  . COPD (chronic obstructive pulmonary disease) ([place])   . Diabetes mellitus without complication ([place])   . Hypertension   . Renal disorder    kidney stones    History reviewed. No pertinent surgical history. None  Family History  Problem Relation Age of Onset  . CAD Father   . CAD Maternal Grandfather   . CAD Paternal Grandfather      Social History:  reports that he has been smoking. He has never used smokeless tobacco. He reports that he does not drink alcohol or use drugs.  Allergies:  Allergies  Allergen Reactions  . Shellfish Allergy     Prior to Admission medications   Medication Sig Start Date End Date Taking? Authorizing Provider  albuterol (PROVENTIL HFA;VENTOLIN HFA) 108 (90 Base) MCG/ACT inhaler Inhale 2 puffs into the lungs every 4 (four) hours as needed. 10/28/16  Yes [provider]  amLODipine (NORVASC) 10 MG tablet Take 10 mg by mouth every morning. 03/18/18  Yes [provider]  chlorthalidone (HYGROTON) 25 MG tablet Take 12.5 mg  by mouth every morning. 05/17/18  Yes [provider]  gabapentin (NEURONTIN) 300 MG capsule Take 2-3 capsules by mouth 3 (three) times daily. 600 mg qam 600 mg noon 900 mg qhs 05/13/18  Yes [provider]  glipiZIDE (GLUCOTROL) 10 MG tablet Take 10 mg by mouth daily. 03/23/18  Yes [provider]  Ipratropium-Albuterol (COMBIVENT) 20-100 MCG/ACT AERS respimat Inhale 1 puff into the lungs 4 (four) times daily. 06/02/17  Yes [provider]  lisinopril (PRINIVIL,ZESTRIL) 40 MG tablet Take 40 mg by mouth daily. 04/23/18  Yes [provider]  metFORMIN (GLUCOPHAGE) 1000 MG tablet Take 1,000 mg by mouth 2 (two) times daily with a meal. 05/04/18  Yes [provider]  omeprazole (PRILOSEC) 20 MG capsule Take 1 capsule by mouth daily. 04/30/18  Yes [provider]  sertraline (ZOLOFT) 100 MG tablet Take 50 mg by mouth daily. 05/17/18  Yes [provider]     Results for orders placed or performed during the hospital encounter of 05/24/18 (from the past 48 hour(s))  Brain natriuretic peptide     Status: None   Collection Time: 05/24/18  2:59 AM  Result Value Ref Range   B Natriuretic Peptide 11.0 0.0 - 100.0 pg/mL    Comment: Performed at [facility], [address] [place], [place] [address]  Comprehensive metabolic panel     Status: Abnormal   Collection Time: 05/24/18  2:59 AM  Result Value Ref Range  Sodium 129 (L) 135 - 145 mmol/L   Potassium 3.8 3.5 - 5.1 mmol/L   Chloride 97 (L) 98 - 111 mmol/L   CO2 22 22 - 32 mmol/L   Glucose, Bld 539 (HH) 70 - 99 mg/dL    Comment: CRITICAL RESULT CALLED TO, READ BACK BY AND VERIFIED WITH KALA KEEN ON 05/24/18 AT 0411 JAG    BUN 20 6 - 20 mg/dL   Creatinine, Ser 1.29 (H) 0.61 - 1.24 mg/dL   Calcium 8.9 8.9 - 10.3 mg/dL   Total Protein 6.8 6.5 - 8.1 g/dL   Albumin 3.6 3.5 - 5.0 g/dL   AST 22 15 - 41 U/L   ALT 26 0 - 44 U/L   Alkaline Phosphatase 119 38 - 126 U/L    Total Bilirubin 0.6 0.3 - 1.2 mg/dL   GFR calc non Af Amer >60 >60 mL/min   GFR calc Af Amer >60 >60 mL/min    Comment: (NOTE) The eGFR has been calculated using the CKD EPI equation. This calculation has not been validated in all clinical situations. eGFR's persistently <60 mL/min signify possible Chronic Kidney Disease.    Anion gap 10 5 - 15    Comment: Performed at [facility], [address] [place], [place] [address]  Troponin I     Status: None   Collection Time: 05/24/18  2:59 AM  Result Value Ref Range   Troponin I <0.03 <0.03 ng/mL    Comment: Performed at [facility], [address] [place], [place] [address]  CBC with Differential/Platelet     Status: Abnormal   Collection Time: 05/24/18  2:59 AM  Result Value Ref Range   WBC 7.6 4.0 - 10.5 K/uL   RBC 4.80 4.22 - 5.81 MIL/uL   Hemoglobin 15.2 13.0 - 17.0 g/dL   HCT 43.0 39.0 - 52.0 %   MCV 89.6 80.0 - 100.0 fL   MCH 31.7 26.0 - 34.0 pg   MCHC 35.3 30.0 - 36.0 g/dL   RDW 13.1 11.5 - 15.5 %   Platelets 134 (L) 150 - 400 K/uL   nRBC 0.0 0.0 - 0.2 %   Neutrophils Relative % 63 %   Neutro Abs 4.8 1.7 - 7.7 K/uL   Lymphocytes Relative 29 %   Lymphs Abs 2.2 0.7 - 4.0 K/uL   Monocytes Relative 5 %   Monocytes Absolute 0.4 0.1 - 1.0 K/uL   Eosinophils Relative 2 %   Eosinophils Absolute 0.2 0.0 - 0.5 K/uL   Basophils Relative 0 %   Basophils Absolute 0.0 0.0 - 0.1 K/uL   Immature Granulocytes 1 %   Abs Immature Granulocytes 0.04 0.00 - 0.07 K/uL    Comment: Performed at [facility], [address] [place], [place] [address]  TSH     Status: None   Collection Time: 05/24/18  5:42 AM  Result Value Ref Range   TSH 0.757 0.350 - 4.500 uIU/mL    Comment: Performed by a 3rd Generation assay with a functional sensitivity of <=0.01 uIU/mL. Performed at [facility], [address] [place], [place] [address]   Hemoglobin A1c     Status: Abnormal   Collection Time: 05/24/18   5:42 AM  Result Value Ref Range   Hgb A1c MFr Bld 12.0 (H) 4.8 - 5.6 %    Comment: (NOTE) Pre diabetes:          5.7%-6.4% Diabetes:              >  6.4% Glycemic control for   <7.0% adults with diabetes    Mean Plasma Glucose 297.7 mg/dL    Comment: Performed at [facility] [address], [place], [place] [address]  Troponin I     Status: Abnormal   Collection Time: 05/24/18  5:42 AM  Result Value Ref Range   Troponin I 0.10 (HH) <0.03 ng/mL    Comment: CRITICAL RESULT CALLED TO, READ BACK BY AND VERIFIED WITH KAYLA KEEN ON 05/24/18 AT 0654 QSD Performed at [facility], [address] [place], [place] [address]   Glucose, capillary     Status: Abnormal   Collection Time: 05/24/18  7:16 AM  Result Value Ref Range   Glucose-Capillary 321 (H) 70 - 99 mg/dL   Dg Chest Port 1 View  Result Date: 05/24/2018 CLINICAL DATA:  57 y/o  M; central to left-sided chest pain. EXAM: PORTABLE CHEST 1 VIEW COMPARISON:  None. FINDINGS: The heart size and mediastinal contours are within normal limits. Both lungs are clear. The visualized skeletal structures are unremarkable. IMPRESSION: No acute pulmonary process identified. Electronically Signed   By: Kristine Garbe M.D.   On: 05/24/2018 03:33    Review of Systems  Constitutional: Negative for chills and fever.  HENT: Negative for sore throat and tinnitus.   Eyes: Negative for blurred vision and redness.  Respiratory: Positive for shortness of breath and wheezing. Negative for cough.   Cardiovascular: Positive for chest pain. Negative for palpitations, orthopnea and PND.  Gastrointestinal: Negative for abdominal pain, diarrhea, nausea and vomiting.  Genitourinary: Negative for dysuria, frequency and urgency.  Musculoskeletal: Negative for joint pain and myalgias.  Skin: Negative for rash.       No lesions  Neurological: Negative for speech change, focal weakness and weakness.  Endo/Heme/Allergies: Does not  bruise/bleed easily.       No temperature intolerance  Psychiatric/Behavioral: Negative for depression and suicidal ideas.    Blood pressure 108/80, pulse (!) 102, temperature 98 F (36.7 C), temperature source Oral, resp. rate 20, height 6' (1.829 m), weight 105.2 kg, SpO2 94 %. Physical Exam  Vitals reviewed. Constitutional: He is oriented to person, place, and time. He appears well-developed and well-nourished. No distress.  HENT:  Head: Normocephalic and atraumatic.  Mouth/Throat: Oropharynx is clear and moist.  Eyes: Pupils are equal, round, and reactive to light. Conjunctivae and EOM are normal. No scleral icterus.  Neck: Normal range of motion. Neck supple. No JVD present. No tracheal deviation present. No thyromegaly present.  Cardiovascular: Normal rate, regular rhythm and normal heart sounds. Exam reveals no gallop and no friction rub.  No murmur heard. Respiratory: Effort normal and breath sounds normal. No respiratory distress. He has no wheezes.  GI: Soft. Bowel sounds are normal. He exhibits no distension. There is no tenderness.  Genitourinary:  Genitourinary Comments: Deferred  Musculoskeletal: Normal range of motion. He exhibits no edema.  Lymphadenopathy:    He has no cervical adenopathy.  Neurological: He is alert and oriented to person, place, and time. No cranial nerve deficit.  Skin: Skin is warm and dry. No rash noted. No erythema.  Psychiatric: He has a normal mood and affect. His behavior is normal. Judgment and thought content normal.     Assessment/Plan This is a 57 year old male admitted for chest pain. 1.  Chest pain: Initial troponin negative.  Follow-up is mildly elevated.  Continue to follow cardiac biomarkers.  Monitor telemetry.  Consult cardiology.  Continue aspirin 2.  Hypertension: Controlled; continue  amlodipine, chlorthalidone and lisinopril. 3.  Diabetes mellitus type II: The patient admits to noncompliance with his diabetic medications.  He  usually has blood sugars in the 200s but has a blood sugar of greater than 500 today.  He is received insulin in the emergency department.  Hold oral hypoglycemic agents.  I have started the patient on basal insulin.  Sliding scale insulin while hospitalized 4.  CKD: This is likely the patient's baseline secondary to hypertension diabetes.  Avoid nephrotoxic agents. 5.  DVT prophylaxis: Heparin 6.  GI prophylaxis: Pantoprazole per home regimen Patient is a full code.  Time spent on admission orders and patient care approximately 45 minutes  Harrie Foreman, MD 05/24/2018, 8:54 AM

## 2018-05-24 NOTE — ED Notes (Signed)
Spoke with Dr. Anselm Jungling over phone and received verbal orders.

## 2018-05-24 NOTE — Progress Notes (Signed)
Noted High troponin- NSTEMI- start heparin drip.

## 2018-05-24 NOTE — ED Notes (Signed)
Report to Jancy, RN  

## 2018-05-24 NOTE — ED Notes (Signed)
Date and time results received: 05/24/18 0653 (use smartphrase ".now" to insert current time)  Test: troponin Critical Value: 0.10  Name of Provider Notified: Dr. Beather Arbour  Orders Received? Or Actions Taken?:

## 2018-05-24 NOTE — Consult Note (Signed)
Brandon for heparin management Indication: chest pain/ACS  Allergies  Allergen Reactions  . Shellfish Allergy     Patient Measurements: Height: 6' (182.9 cm) Weight: 232 lb (105.2 kg) IBW/kg (Calculated) : 77.6 Heparin Dosing Weight: 99.4 kg  Vital Signs: Temp: 98.3 F (36.8 C) (10/17 1147) Temp Source: Oral (10/17 1147) BP: 136/94 (10/17 1147) Pulse Rate: 92 (10/17 1147)  Labs: Recent Labs    05/24/18 0259 05/24/18 0542 05/24/18 1150  HGB 15.2  --   --   HCT 43.0  --   --   PLT 134*  --   --   CREATININE 1.29*  --   --   TROPONINI <0.03 0.10* 0.48*    Estimated Creatinine Clearance: 80.1 mL/min (A) (by C-G formula based on SCr of 1.29 mg/dL (H)).   Medical History: Past Medical History:  Diagnosis Date  . Cancer (HCC)    Hx of skin cancer  . COPD (chronic obstructive pulmonary disease) ([place])   . Diabetes mellitus without complication ([place])   . Hypertension   . Renal disorder    kidney stones   Assessment: 57 y.o. male PMH of DM and HTN. Troponin is elevated at 0.48 and increasing consistent with NSTEMI. He is on no anticoagulants PTA. Baseline labs have been drawn and are pending  Goal of Therapy:  Heparin level 0.3-0.7 units/ml Monitor platelets by anticoagulation protocol: Yes   Plan:  Give 4000 units bolus x 1 Start heparin infusion at 1250 units/hr Check anti-Xa level in 6 hours and daily while on heparin Continue to monitor H&H and platelets  Dallie Piles, PharmD 05/24/2018,12:57 PM

## 2018-05-24 NOTE — Progress Notes (Signed)
Inpatient Diabetes Program Recommendations  AACE/ADA: New Consensus Statement on Inpatient Glycemic Control (2019)  Target Ranges:  Prepandial:   less than 140 mg/dL      Peak postprandial:   less than 180 mg/dL (1-2 hours)      Critically ill patients:  140 - 180 mg/dL   Results for Brandon Johnston, Brandon Johnston (MRN [id number]) as of 05/24/2018 14:33  Ref. Range 05/24/18 06:50 05/24/2018 07:16 05/24/2018 08:59 05/24/2018 12:38  Glucose-Capillary Latest Ref Range: 70 - 99 mg/dL   Lantus 18 units 321 (H) 417 (H)  Novolog 12 units 418 (H)  Novolog 8 units   Results for Brandon Johnston, Brandon Johnston (MRN [id number]) as of 05/24/2018 14:33  Ref. Range 05/24/2018 02:59 05/24/18 04:39 05/24/2018 05:42  Glucose Latest Ref Range: 70 - 99 mg/dL 539 (HH)   Novolog 10 units   Hemoglobin A1C Latest Ref Range: 4.8 - 5.6 %   12.0 (H)   Review of Glycemic Control  Diabetes history: DM2 Outpatient Diabetes medications: Glipizide 10 mg daily, Metformin 1000 mg BID Current orders for Inpatient glycemic control: Lantus 18 units QHS, Novolog 0-9 units TID with meals, Novolog 0-5 units QHS  Inpatient Diabetes Program Recommendations:  Insulin-Basal: Please consider increasing Lantus to 23 units daily. Insulin-Correction: Please increase Novolog correction to 0-15 units TID.  If patient will remain NPO, please consider changing frequency of CBGs and Novolog to Q4H. HgbA1C: A1C 12% on 05/24/18 indicating an average glucose of 298 mg/dl over the past 2-3 months.   NOTE: Patient received Solumedrol 125 mg at 3:18 am today which is contributing to hyperglycemia. Spoke with patient about diabetes and home regimen for diabetes control. Patient appeared very disinterested in conversation as he continued to scroll through his phone during most of the conversation and gave short "Yes, No, I don't know" responses to most questions.  Patient is followed by PCP for DM control and he reports he takes DM medications as prescribed "most  of the time".  Patient states that he checks his glucose 1 time per day and that it is usually in the 200's mg/dl. Discussed checking glucose 2-3 times per day and patient stated "I don't have time for that. I doing got to check it once a day when I get home from work".   Inquired about prior A1C and patient reports that he does not know his last A1C value. Discussed A1C results (12.0% on 05/24/18) and explained that his current A1C indicates an average glucose of 298 mg/dl over the past 2-3 months. Discussed glucose and A1C goals. Discussed importance of checking CBGs and maintaining good CBG control to prevent long-term and short-term complications. Explained how hyperglycemia leads to damage within blood vessels which lead to the common complications seen with uncontrolled diabetes. Stressed to the patient the importance of improving glycemic control to prevent further complications from uncontrolled diabetes especially with him presenting with chest pain and likely NSTEMI. Noted PCP note on 04/27/18 regarding foot pain. Inquired if patient has had any steroid injections in his feet in the past 2-3 months and he denies receiving any steroid injection recently.  Explained that more frequency glucose monitoring would provide more data for MD to make adjustments with DM medications. Discussed FreeStyle Libre (glucose monitoring sensor) and encouraged patient to discuss with PCP if he is interested. Patient verbalized understanding of information discussed and he states that he has no further questions at this time related to diabetes.  Thanks, Barnie Alderman, RN, MSN, CDE Diabetes Coordinator Inpatient Diabetes Program  [phone] (Team Pager)

## 2018-05-24 NOTE — Progress Notes (Signed)
*  PRELIMINARY RESULTS* Echocardiogram 2D Echocardiogram has been performed.  Sherrie Sport 05/24/2018, 2:33 PM

## 2018-05-25 ENCOUNTER — Encounter: Payer: Self-pay | Admitting: Internal Medicine

## 2018-05-25 DIAGNOSIS — J441 Chronic obstructive pulmonary disease with (acute) exacerbation: Secondary | ICD-10-CM | POA: Diagnosis not present

## 2018-05-25 LAB — BASIC METABOLIC PANEL
Anion gap: 10 (ref 5–15)
BUN: 24 mg/dL — ABNORMAL HIGH (ref 6–20)
CALCIUM: 8.4 mg/dL — AB (ref 8.9–10.3)
CHLORIDE: 103 mmol/L (ref 98–111)
CO2: 24 mmol/L (ref 22–32)
CREATININE: 1.39 mg/dL — AB (ref 0.61–1.24)
GFR calc Af Amer: >60 mL/min (ref >60)
GFR, EST NON AFRICAN AMERICAN: 55 mL/min — AB (ref >60)
Glucose, Bld: 339 mg/dL — ABNORMAL HIGH (ref 70–99)
Potassium: 3.8 mmol/L (ref 3.5–5.1)
SODIUM: 137 mmol/L (ref 135–145)

## 2018-05-25 LAB — LIPID PANEL
CHOLESTEROL: 223 mg/dL — AB (ref 0–200)
HDL: 26 mg/dL — ABNORMAL LOW (ref >40)
LDL Cholesterol: UNDETERMINED mg/dL (ref 0–99)
TRIGLYCERIDES: 617 mg/dL — AB (ref <150)
Total CHOL/HDL Ratio: 8.6 RATIO
VLDL: UNDETERMINED mg/dL (ref 0–40)

## 2018-05-25 LAB — GLUCOSE, CAPILLARY
Glucose-Capillary: 292 mg/dL — ABNORMAL HIGH (ref 70–99)
Glucose-Capillary: 324 mg/dL — ABNORMAL HIGH (ref 70–99)
Glucose-Capillary: 325 mg/dL — ABNORMAL HIGH (ref 70–99)
Glucose-Capillary: 359 mg/dL — ABNORMAL HIGH (ref 70–99)
Glucose-Capillary: 522 mg/dL (ref 70–99)

## 2018-05-25 MED ORDER — GLIPIZIDE 10 MG PO TABS
10.0000 mg | ORAL_TABLET | Freq: Every day | ORAL | Status: DC
  Administered 2018-05-25: 10 mg via ORAL
  Filled 2018-05-25: qty 1

## 2018-05-25 MED ORDER — SUCRALFATE 1 GM/10ML PO SUSP: 1.0000 g | Freq: Three times a day (TID) | ORAL | 0 refills | Status: DC

## 2018-05-25 MED ORDER — INSULIN PEN NEEDLE 32G X 4 MM MISC: 1.0000 | Freq: Every morning | 0 refills | Status: AC

## 2018-05-25 MED ORDER — INSULIN REGULAR HUMAN 100 UNIT/ML IJ SOLN
20.0000 [IU] | Freq: Once | INTRAMUSCULAR | Status: AC
  Administered 2018-05-25: 20 [IU] via INTRAVENOUS
  Filled 2018-05-25: qty 10

## 2018-05-25 MED ORDER — INSULIN GLARGINE 100 UNIT/ML SOLOSTAR PEN: 20.0000 [IU] | PEN_INJECTOR | Freq: Every day | SUBCUTANEOUS | 11 refills | Status: AC

## 2018-05-25 MED ORDER — INSULIN STARTER KIT- PEN NEEDLES (ENGLISH)
1.0000 | Freq: Once | Status: AC
  Administered 2018-05-25: 1
  Filled 2018-05-25 (×3): qty 1

## 2018-05-25 MED ORDER — INSULIN GLARGINE 100 UNIT/ML ~~LOC~~ SOLN
25.0000 [IU] | Freq: Every day | SUBCUTANEOUS | Status: DC
  Administered 2018-05-25: 25 [IU] via SUBCUTANEOUS
  Filled 2018-05-25: qty 0.25

## 2018-05-25 MED ORDER — INSULIN ASPART 100 UNIT/ML ~~LOC~~ SOLN
12.0000 [IU] | Freq: Once | SUBCUTANEOUS | Status: AC
  Administered 2018-05-25: 12 [IU] via SUBCUTANEOUS
  Filled 2018-05-25: qty 1

## 2018-05-25 MED ORDER — ATORVASTATIN CALCIUM 20 MG PO TABS: 20.0000 mg | ORAL_TABLET | Freq: Every day | ORAL | 1 refills | Status: DC

## 2018-05-25 NOTE — Progress Notes (Signed)
Pt discharged home with wife. Escorted to vehicle by volunteer services.

## 2018-05-25 NOTE — Progress Notes (Signed)
Pt given instructions on insulin injections. Pt administered insulin dose without difficulty. Wife at bedside. Both verbalize understanding of instructions given. Diabetic educator in to see pt.

## 2018-05-25 NOTE — Progress Notes (Addendum)
Inpatient Diabetes Program Recommendations  AACE/ADA: New Consensus Statement on Inpatient Glycemic Control (2019)  Target Ranges:  Prepandial:   less than 140 mg/dL      Peak postprandial:   less than 180 mg/dL (1-2 hours)      Critically ill patients:  140 - 180 mg/dL   Results for Brandon Johnston, Brandon Johnston (MRN [id number]) as of 05/25/2018 07:45  Ref. Range 05/24/2018 08:59 05/24/2018 12:38 05/24/2018 15:29 05/24/2018 17:33 05/24/2018 22:23 05/24/2018 23:17 05/25/2018 00:14 05/25/2018 02:56 05/25/2018 06:11  Glucose-Capillary Latest Ref Range: 70 - 99 mg/dL 417 (H)  Novolog 12 units 418 (H)  Novolog 8 units 330 (H) 325 (H)  Novolog 7 units 509 (HH) 475 (H)  Regular 15 units 522 (HH)  Regular 20 units 359 (H)  Novolog 12 units 325 (H)   Review of Glycemic Control  Diabetes history: DM2 Outpatient Diabetes medications: Glipizide 10 mg daily, Metformin 1000 mg BID Current orders for Inpatient glycemic control: Lantus 18 units QHS, Novolog 0-15 units ACHS  Inpatient Diabetes Program Recommendations:  Insulin-Basal: Lantus 18 units was given last at 6:50 am on 05/24/18. Please consider increasing Lantus to 25 units and change frequency to daily (to start now). Insulin-Correction: Please increase Novolog correction to 0-20 units ACHS.  Insulin-Meal Coverage: Please consider ordering Novolog 5 units TID with meals for meal coverage. Labs: Patient's glucose is not responding very much to excessive amount of insulin he has been given. Question if triglycerides are elevated causing insulin resistance. MD may want to consider ordering lab for triglyceride level.  HgbA1C: A1C 12% on 05/24/18 indicating an average glucose of 298 mg/dl over the past 2-3 months. Patient will likely need insulin for outpatient DM control.  Addendum 05/25/18_0 :30-Spoke with patient and his wife regarding DM control and insulin. Patient much more interested and engaged in conversation today.  Reviewed information  discussed yesterday regarding importance of DM control. Patient has Living Well with Diabetes book at bedside. Reviewed how hyperglycemia damages inner lining of blood vessels and leads to complications and how it effects nerve endings and causes neuropathy. Reviewed glucose goals. Patient reports he self-administered insulin and he felt comfortable with self-injecting insulin. Patient is aware he will be discharged on insulin and prefers to use insulin pens. Discussed Lantus insulin in detail (how to take it, onset, duration). Provided Lantus savings card (which could decrease copay to $0). Educated patient and spouse on insulin pen use at home. Informed patient he will receive insulin flexpen starter kit from RN once pharmacy sends to unit. Reviewed all steps of insulin pen including attachment of needle, 2-unit air shot, dialing up dose, giving injection, removing needle, disposal of sharps, storage of unused insulin, disposal of insulin etc. Patient able to provide successful return demonstration. Reviewed hyperglycemia and hypoglycemia along with treatment for both. Encouraged patient to purchase glucose tablets to keep on hand at all times in case he experiences hypoglycemia. Patient reports that he usually gets symptoms of hypoglycemia when his glucose gets down to 120's mg/dl. Explained that his body has been use to his glucose being very elevated and when it comes down to normal value he is getting symptoms of hypoglycemia. Explained that his body will "reset" itself to tolerate lower glucose values as his glycemic control continues to improve. Discussed FreeStyle Libre and encouraged patient to ask PCP about it if interested. Encouraged patient to check glucose at least 2 times per day and keep a record of glucose values. Encouraged patient to follow up with PCP within  1 week since he is starting new to insulin. Advised patient to call PCP if he has any issues with hypoglycemia for advice on adjustments  with DM medications. Patient verbalized understanding of information and states that he has no further questions at this time.   MD to give patient Rxs for Lantus SoloStar insulin pens and insulin pen needles (order [phone]).  Thanks, Barnie Alderman, RN, MSN, CDE Diabetes Coordinator Inpatient Diabetes Program [phone] (Team Pager from 8am to 5pm)

## 2018-05-25 NOTE — Progress Notes (Signed)
Pt's CBG went from 509 to 325 on shift. MD Jannifer Franklin made changes to sliding scale. Pt had a total of 35 units of regular insulin IV and 12 units of novolog. MD Marcille Blanco made aware of new levels. No new orders at this time.

## 2018-05-25 NOTE — Progress Notes (Signed)
[facility] Encounter Note  Patient: Brandon Johnston / Admit Date: 05/24/2018 / Date of Encounter: 05/25/2018, 8:04 AM   Subjective: Patient had full resolution of chest pain.  No EKG changes.  Slight elevation of troponin And catheterization showing normal LV systolic function with ejection fraction of 55% Normal coronary arteries without evidence of stenosis  Review of Systems: Positive for: none Negative for: Vision change, hearing change, syncope, dizziness, nausea, vomiting,diarrhea, bloody stool, stomach pain, cough, congestion, diaphoresis, urinary frequency, urinary pain,skin lesions, skin rashes Others previously listed  Objective: Telemetry: Sinus rhythm Physical Exam: Blood pressure 104/73, pulse 85, temperature 97.9 F (36.6 C), temperature source Oral, resp. rate 18, height 6' (1.829 m), weight 104.8 kg, SpO2 94 %. Body mass index is 31.33 kg/m. General: Well developed, well nourished, in no acute distress. Head: Normocephalic, atraumatic, sclera non-icteric, no xanthomas, nares are without discharge. Neck: No apparent masses Lungs: Normal respirations with no wheezes, no rhonchi, no rales , no crackles   Heart: Regular rate and rhythm, normal S1 S2, no murmur, no rub, no gallop, PMI is normal size and placement, carotid upstroke normal without bruit, jugular venous pressure normal Abdomen: Soft, non-tender, non-distended with normoactive bowel sounds. No hepatosplenomegaly. Abdominal aorta is normal size without bruit Extremities: No edema, no clubbing, no cyanosis, no ulcers,  Peripheral: 2+ radial, 2+ femoral, 2+ dorsal pedal pulses Neuro: Alert and oriented. Moves all extremities spontaneously. Psych:  Responds to questions appropriately with a normal affect.   Intake/Output Summary (Last 24 hours) at 05/25/2018 0804 Last data filed at 05/25/2018 0401 Gross per 24 hour  Intake 1176.87 ml  Output 600 ml  Net 576.87 ml    Inpatient  Medications:  . amLODipine  10 mg Oral BH-q7a  . aspirin  324 mg Oral Once  . chlorthalidone  12.5 mg Oral q morning - 10a  . docusate sodium  100 mg Oral BID  . gabapentin  900 mg Oral QHS  . gabapentin  600 mg Oral BID  . insulin aspart  0-15 Units Subcutaneous TID AC & HS  . insulin glargine  18 Units Subcutaneous QHS  . lisinopril  40 mg Oral Daily  . living well with diabetes book   Does not apply Once  . pantoprazole  40 mg Oral Daily  . sertraline  50 mg Oral Daily  . sodium chloride flush  3 mL Intravenous Q12H  . sucralfate  1 g Oral TID WC & HS   Infusions:    Labs: Recent Labs    05/24/18 0259  NA 129*  K 3.8  CL 97*  CO2 22  GLUCOSE 539*  BUN 20  CREATININE 1.29*  CALCIUM 8.9   Recent Labs    05/24/18 0259  AST 22  ALT 26  ALKPHOS 119  BILITOT 0.6  PROT 6.8  ALBUMIN 3.6   Recent Labs    05/24/18 0259  WBC 7.6  NEUTROABS 4.8  HGB 15.2  HCT 43.0  MCV 89.6  PLT 134*   Recent Labs    05/24/18 0259 05/24/18 0542 05/24/18 1150 05/24/18 1747  TROPONINI <0.03 0.10* 0.48* 0.94*   Invalid input(s): POCBNP Recent Labs    05/24/18 0542  HGBA1C 12.0*     Weights: Filed Weights   05/24/18 0250 05/24/18 1521 05/25/18 0554  Weight: 105.2 kg 105.2 kg 104.8 kg     Radiology/Studies:  Dg Chest Port 1 View  Result Date: 05/24/2018 CLINICAL DATA:  57 y/o  M; central to left-sided  chest pain. EXAM: PORTABLE CHEST 1 VIEW COMPARISON:  None. FINDINGS: The heart size and mediastinal contours are within normal limits. Both lungs are clear. The visualized skeletal structures are unremarkable. IMPRESSION: No acute pulmonary process identified. Electronically Signed   By: Kristine Garbe M.D.   On: 05/24/2018 03:33     Assessment and Recommendation  57 y.o. male with diabetes hypertension hyperlipidemia tobacco abuse with acute episode of chest pain with minimal elevation of troponin and no EKG changes with subsequent cardiac catheterization  showing normal coronary arteries and normal LV systolic function 1.  No further cardiac intervention or diagnostics necessary at this time 2.  Continue risk factor modification for cardiovascular disease risk reduction including antihypertensives, high intensity cholesterol therapy, and treatment for diabetes 3.  Patient was counseled on the discontinuation of tobacco abuse 4.  Begin ambulation and follow for improvements of symptoms and other potential causes 5.  If ambulating well with any further significant symptoms okay for discharge home from cardiac standpoint  Signed, Serafina Royals M.D. FACC

## 2018-05-25 NOTE — Progress Notes (Signed)
[place] [facility] [place] NAME: Brandon Johnston    MR#:  [id number]  DATE OF BIRTH:  09/04/1960  SUBJECTIVE:  CHIEF COMPLAINT:  No chief complaint on file.  Came with chest pain.  REVIEW OF SYSTEMS:  CONSTITUTIONAL: No fever, fatigue or weakness.  EYES: No blurred or double vision.  EARS, NOSE, AND THROAT: No tinnitus or ear pain.  RESPIRATORY: No cough, shortness of breath, wheezing or hemoptysis.  CARDIOVASCULAR: have chest pain, no orthopnea, edema.  GASTROINTESTINAL: No nausea, vomiting, diarrhea or abdominal pain.  GENITOURINARY: No dysuria, hematuria.  ENDOCRINE: No polyuria, nocturia,  HEMATOLOGY: No anemia, easy bruising or bleeding SKIN: No rash or lesion. MUSCULOSKELETAL: No joint pain or arthritis.   NEUROLOGIC: No tingling, numbness, weakness.  PSYCHIATRY: No anxiety or depression.   ROS  DRUG ALLERGIES:   Allergies  Allergen Reactions  . Shellfish Allergy     VITALS:  Blood pressure 104/73, pulse 85, temperature 97.9 F (36.6 C), temperature source Oral, resp. rate 18, height 6' (1.829 m), weight 104.8 kg, SpO2 94 %.  PHYSICAL EXAMINATION:  GENERAL:  57 y.o.-year-old patient lying in the bed with no acute distress.  EYES: Pupils equal, round, reactive to light and accommodation. No scleral icterus. Extraocular muscles intact.  HEENT: Head atraumatic, normocephalic. Oropharynx and nasopharynx clear.  NECK:  Supple, no jugular venous distention. No thyroid enlargement, no tenderness.  LUNGS: Normal breath sounds bilaterally, no wheezing, rales,rhonchi or crepitation. No use of accessory muscles of respiration.  CARDIOVASCULAR: S1, S2 normal. No murmurs, rubs, or gallops.  ABDOMEN: Soft, nontender, nondistended. Bowel sounds present. No organomegaly or mass.  EXTREMITIES: No pedal edema, cyanosis, or clubbing.  NEUROLOGIC: Cranial nerves II through XII are intact. Muscle strength 5/5 in all extremities. Sensation intact.  Gait not checked.  PSYCHIATRIC: The patient is alert and oriented x 3.  SKIN: No obvious rash, lesion, or ulcer.   Physical Exam LABORATORY PANEL:   CBC Recent Labs  Lab 05/24/18 0259  WBC 7.6  HGB 15.2  HCT 43.0  PLT 134*   ------------------------------------------------------------------------------------------------------------------  Chemistries  Recent Labs  Lab 05/24/18 0259  NA 129*  K 3.8  CL 97*  CO2 22  GLUCOSE 539*  BUN 20  CREATININE 1.29*  CALCIUM 8.9  AST 22  ALT 26  ALKPHOS 119  BILITOT 0.6   ------------------------------------------------------------------------------------------------------------------  Cardiac Enzymes Recent Labs  Lab 05/24/18 1150 05/24/18 1747  TROPONINI 0.48* 0.94*   ------------------------------------------------------------------------------------------------------------------  RADIOLOGY:  Dg Chest Port 1 View  Result Date: 05/24/2018 CLINICAL DATA:  57 y/o  M; central to left-sided chest pain. EXAM: PORTABLE CHEST 1 VIEW COMPARISON:  None. FINDINGS: The heart size and mediastinal contours are within normal limits. Both lungs are clear. The visualized skeletal structures are unremarkable. IMPRESSION: No acute pulmonary process identified. Electronically Signed   By: Kristine Garbe M.D.   On: 05/24/2018 03:33    ASSESSMENT AND PLAN:   Active Problems:   Chest pain  * NSTEMI   Elevated troponin, + Risk factors   Heparine drip   Cardio consult.   Follow serial troponin, keep NPO.  * DM   Hold oral meds, keep on ISS.   HBA1c high  * Htn   Cont home meds  * Hyperlipidemia   Check Lipid panel  * Active smoking   Counseled to Quit smoking for 4 min.    * GERD   PPI, added sucralfate.  All the records are reviewed and case discussed  with Care Management/Social Workerr. Management plans discussed with the patient, family and they are in agreement.  CODE STATUS: Full.  TOTAL TIME TAKING  CARE OF THIS PATIENT: 45 minutes.     POSSIBLE D/C IN 1-2 DAYS, DEPENDING ON CLINICAL CONDITION.   Vaughan Basta M.D on 05/25/2018   Between 7am to 6pm - Pager - [phone]  After 6pm go to [email or web address] - password [email or web address] [facility]  Office  [phone]  CC: Primary care physician; System, Pcp Not In  Note: This dictation was prepared with Dragon dictation along with smaller phrase technology. Any transcriptional errors that result from this process are unintentional.

## 2018-05-25 NOTE — Care Management Note (Signed)
Case Management Note  Patient Details  Name: Brandon Johnston MRN: [id number] Date of Birth: July 28, 1961  Subjective/Objective:         Independent in all adls, denies issues accessing medical care, obtaining medications or with transportation.  Current with PCP.  No discharge needs identified at present by care manager or members of care team             Action/Plan:   Expected Discharge Date:  05/25/18               Expected Discharge Plan:  Home/Self Care  In-House Referral:     Discharge planning Services  CM Consult  Post Acute Care Choice:    Choice offered to:     DME Arranged:    DME Agency:     HH Arranged:    [place] Agency:     Status of Service:  Completed, signed off  If discussed at [employer] of Stay Meetings, dates discussed:    Additional Comments:  Elza Rafter, RN 05/25/2018, 2:03 PM

## 2018-05-25 NOTE — Discharge Instructions (Signed)
Follow with PMD- [facility] primary care- in 1 week. Keep a record of Blood sugar 3 times a day and discuss with your physician. If you see blood sugar readings < 100 anytime- need to stop taking glipizide and still stay low- call your doctors office. If any reading of < 60 blood sugar- give Candy / ice cream/ chocolate/ sugary juice and call 911 right away.

## 2018-05-25 NOTE — Progress Notes (Signed)
Discharge instructions given. Pt and wife verbalize understanding. 

## 2018-05-28 ENCOUNTER — Encounter: Payer: Self-pay | Admitting: *Deleted

## 2018-06-03 NOTE — Discharge Summary (Signed)
[place] at [place] NAME: Brandon Johnston    MR#:  [id number]  DATE OF BIRTH:  [birth year]  DATE OF ADMISSION:  05/24/2018 ADMITTING PHYSICIAN: Harrie Foreman, MD  DATE OF DISCHARGE: 05/25/2018  2:30 PM  PRIMARY CARE PHYSICIAN: System, Pcp Not In    ADMISSION DIAGNOSIS:  Hyperglycemia [R73.9] COPD with acute exacerbation (HCC) [J44.1] Chest pain, unspecified type [R07.9] Type 2 diabetes mellitus without complication, unspecified whether long term insulin use ([place]) [E11.9]  DISCHARGE DIAGNOSIS:  Active Problems:   Chest pain   NSTEMI ruled out.  SECONDARY DIAGNOSIS:   Past Medical History:  Diagnosis Date  . Cancer (HCC)    Hx of skin cancer  . COPD (chronic obstructive pulmonary disease) ([place])   . Diabetes mellitus without complication ([place])   . Hypertension   . Renal disorder    kidney stones    HOSPITAL COURSE:   * NSTEMI- suspected but ruled out by negative cardiac cath.   Elevated troponin, + Risk factors   Heparine drip   Cardio consult.   Follow serial troponin, keep NPO.  Cath report negative for CAD- advise management of his GERD.  * DM   Hold oral meds, keep on ISS.   HBA1c high  * Htn   Cont home meds  * Hyperlipidemia   Check Lipid panel  * Active smoking   Counseled to Quit smoking for 4 min.    * GERD   PPI, added sucralfate.  DISCHARGE CONDITIONS:   Stable.  CONSULTS OBTAINED:  Treatment Team:  Corey Skains, MD  DRUG ALLERGIES:   Allergies  Allergen Reactions  . Shellfish Allergy     DISCHARGE MEDICATIONS:   Allergies as of 05/25/2018      Reactions   Shellfish Allergy       Medication List    TAKE these medications   albuterol 108 (90 Base) MCG/ACT inhaler Commonly known as:  PROVENTIL HFA;VENTOLIN HFA Inhale 2 puffs into the lungs every 4 (four) hours as needed.   amLODipine 10 MG tablet Commonly known as:  NORVASC Take 10 mg by mouth every  morning.   atorvastatin 20 MG tablet Commonly known as:  LIPITOR Take 1 tablet (20 mg total) by mouth daily.   chlorthalidone 25 MG tablet Commonly known as:  HYGROTON Take 12.5 mg by mouth every morning.   gabapentin 300 MG capsule Commonly known as:  NEURONTIN Take 2-3 capsules by mouth 3 (three) times daily. 600 mg qam 600 mg noon 900 mg qhs   glipiZIDE 10 MG tablet Commonly known as:  GLUCOTROL Take 10 mg by mouth daily.   Insulin Glargine 100 UNIT/ML Solostar Pen Commonly known as:  LANTUS Inject 20 Units into the skin daily.   Insulin Pen Needle 32G X 4 MM Misc 1 each by Does not apply route every morning.   Ipratropium-Albuterol 20-100 MCG/ACT Aers respimat Commonly known as:  COMBIVENT Inhale 1 puff into the lungs 4 (four) times daily.   lisinopril 40 MG tablet Commonly known as:  PRINIVIL,ZESTRIL Take 40 mg by mouth daily.   metFORMIN 1000 MG tablet Commonly known as:  GLUCOPHAGE Take 1,000 mg by mouth 2 (two) times daily with a meal.   omeprazole 20 MG capsule Commonly known as:  PRILOSEC Take 1 capsule by mouth daily.   sertraline 100 MG tablet Commonly known as:  ZOLOFT Take 50 mg by mouth daily.   sucralfate 1 GM/10ML suspension Commonly known as:  CARAFATE Take 10 mLs (1 g total) by mouth 4 (four) times daily -  with meals and at bedtime.        DISCHARGE INSTRUCTIONS:    Follow with PMD in 1-2 weeks.  If you experience worsening of your admission symptoms, develop shortness of breath, life threatening emergency, suicidal or homicidal thoughts you must seek medical attention immediately by calling 911 or calling your MD immediately  if symptoms less severe.  You Must read complete instructions/literature along with all the possible adverse reactions/side effects for all the Medicines you take and that have been prescribed to you. Take any new Medicines after you have completely understood and accept all the possible adverse reactions/side  effects.   Please note  You were cared for by a hospitalist during your hospital stay. If you have any questions about your discharge medications or the care you received while you were in the hospital after you are discharged, you can call the unit and asked to speak with the hospitalist on call if the hospitalist that took care of you is not available. Once you are discharged, your primary care physician will handle any further medical issues. Please note that NO REFILLS for any discharge medications will be authorized once you are discharged, as it is imperative that you return to your primary care physician (or establish a relationship with a primary care physician if you do not have one) for your aftercare needs so that they can reassess your need for medications and monitor your lab values.    Today   CHIEF COMPLAINT:  No chief complaint on file.   HISTORY OF PRESENT ILLNESS:  Brandon Johnston  is a 57 y.o. male with a known history of diabetes, hypertension and COPD presents to the emergency department complaining of chest pain.  The patient's pain began while he was sitting at work.  He does not have a strenuous job.  He began to have very labored breathing as well as substernal chest pain.  It did not radiate.  The patient denies nausea, vomiting or diaphoresis.  He was wheezing upon EMS arrival area thus they administered Solu-Medrol as well as his breathing treatments.  Patient also had nitroglycerin and had Nitropaste applied in the emergency department which has relieved his chest pain tremendously.  It lingers some.  Due to ongoing chest pain and multiple cardiac risk factors the emergency department staff called the hospitalist service for further evaluation.   VITAL SIGNS:  Blood pressure 104/73, pulse 85, temperature 97.9 F (36.6 C), temperature source Oral, resp. rate 18, height 6' (1.829 m), weight 104.8 kg, SpO2 94 %.  I/O:  No intake or output data in the 24 hours ending  06/03/18 1741  PHYSICAL EXAMINATION:  GENERAL:  57 y.o.-year-old patient lying in the bed with no acute distress.  EYES: Pupils equal, round, reactive to light and accommodation. No scleral icterus. Extraocular muscles intact.  HEENT: Head atraumatic, normocephalic. Oropharynx and nasopharynx clear.  NECK:  Supple, no jugular venous distention. No thyroid enlargement, no tenderness.  LUNGS: Normal breath sounds bilaterally, no wheezing, rales,rhonchi or crepitation. No use of accessory muscles of respiration.  CARDIOVASCULAR: S1, S2 normal. No murmurs, rubs, or gallops.  ABDOMEN: Soft, non-tender, non-distended. Bowel sounds present. No organomegaly or mass.  EXTREMITIES: No pedal edema, cyanosis, or clubbing.  NEUROLOGIC: Cranial nerves II through XII are intact. Muscle strength 5/5 in all extremities. Sensation intact. Gait not checked.  PSYCHIATRIC: The patient is alert and oriented x 3.  SKIN: No obvious rash, lesion, or ulcer.   DATA REVIEW:   CBC No results for input(s): WBC, HGB, HCT, PLT in the last 168 hours.  Chemistries  No results for input(s): NA, K, CL, CO2, GLUCOSE, BUN, CREATININE, CALCIUM, MG, AST, ALT, ALKPHOS, BILITOT in the last 168 hours.  Invalid input(s): GFRCGP  Cardiac Enzymes No results for input(s): TROPONINI in the last 168 hours.  Microbiology Results  No results found for this or any previous visit.  RADIOLOGY:  No results found.  EKG:  No orders found for this or any previous visit.    Management plans discussed with the patient, family and they are in agreement.  CODE STATUS:  Code Status History    Date Active Date Inactive Code Status Order ID Comments User Context   05/24/2018 0503 05/25/2018 1751 Full Code [id number]  Harrie Foreman, MD ED      TOTAL TIME TAKING CARE OF THIS PATIENT: 35 minutes.    Vaughan Basta M.D on 06/03/2018 at 5:41 PM  Between 7am to 6pm - Pager - [phone]  After 6pm go to [email or web address] -  password [email or web address] [facility]  Office  [phone]  CC: Primary care physician; System, Pcp Not In   Note: This dictation was prepared with Dragon dictation along with smaller phrase technology. Any transcriptional errors that result from this process are unintentional.

## 2019-07-08 ENCOUNTER — Emergency Department: Payer: BC Managed Care – PPO

## 2019-07-08 ENCOUNTER — Encounter: Payer: Self-pay | Admitting: Emergency Medicine

## 2019-07-08 ENCOUNTER — Other Ambulatory Visit: Payer: Self-pay

## 2019-07-08 ENCOUNTER — Inpatient Hospital Stay
Admission: EM | Admit: 2019-07-08 | Discharge: 2019-07-09 | DRG: 305 | Disposition: A | Discharge status: Home / Self Care | Payer: BC Managed Care – PPO | Attending: Internal Medicine | Admitting: Internal Medicine

## 2019-07-08 DIAGNOSIS — J449 Chronic obstructive pulmonary disease, unspecified: Secondary | ICD-10-CM | POA: Diagnosis present

## 2019-07-08 DIAGNOSIS — K219 Gastro-esophageal reflux disease without esophagitis: Secondary | ICD-10-CM | POA: Diagnosis present

## 2019-07-08 DIAGNOSIS — I161 Hypertensive emergency: Secondary | ICD-10-CM | POA: Diagnosis present

## 2019-07-08 DIAGNOSIS — F329 Major depressive disorder, single episode, unspecified: Secondary | ICD-10-CM | POA: Diagnosis present

## 2019-07-08 DIAGNOSIS — I10 Essential (primary) hypertension: Secondary | ICD-10-CM

## 2019-07-08 DIAGNOSIS — E782 Mixed hyperlipidemia: Secondary | ICD-10-CM | POA: Diagnosis present

## 2019-07-08 DIAGNOSIS — Z794 Long term (current) use of insulin: Secondary | ICD-10-CM

## 2019-07-08 DIAGNOSIS — Z8249 Family history of ischemic heart disease and other diseases of the circulatory system: Secondary | ICD-10-CM | POA: Diagnosis not present

## 2019-07-08 DIAGNOSIS — I160 Hypertensive urgency: Secondary | ICD-10-CM | POA: Diagnosis present

## 2019-07-08 DIAGNOSIS — R778 Other specified abnormalities of plasma proteins: Secondary | ICD-10-CM

## 2019-07-08 DIAGNOSIS — Z20828 Contact with and (suspected) exposure to other viral communicable diseases: Secondary | ICD-10-CM | POA: Diagnosis present

## 2019-07-08 DIAGNOSIS — Z85828 Personal history of other malignant neoplasm of skin: Secondary | ICD-10-CM | POA: Diagnosis not present

## 2019-07-08 DIAGNOSIS — F17210 Nicotine dependence, cigarettes, uncomplicated: Secondary | ICD-10-CM | POA: Diagnosis present

## 2019-07-08 DIAGNOSIS — I248 Other forms of acute ischemic heart disease: Secondary | ICD-10-CM

## 2019-07-08 DIAGNOSIS — E1142 Type 2 diabetes mellitus with diabetic polyneuropathy: Secondary | ICD-10-CM | POA: Diagnosis present

## 2019-07-08 DIAGNOSIS — N200 Calculus of kidney: Secondary | ICD-10-CM | POA: Diagnosis present

## 2019-07-08 DIAGNOSIS — E7800 Pure hypercholesterolemia, unspecified: Secondary | ICD-10-CM | POA: Diagnosis present

## 2019-07-08 DIAGNOSIS — Z720 Tobacco use: Secondary | ICD-10-CM

## 2019-07-08 DIAGNOSIS — E119 Type 2 diabetes mellitus without complications: Secondary | ICD-10-CM

## 2019-07-08 DIAGNOSIS — R079 Chest pain, unspecified: Secondary | ICD-10-CM | POA: Diagnosis present

## 2019-07-08 DIAGNOSIS — Z91013 Allergy to seafood: Secondary | ICD-10-CM

## 2019-07-08 LAB — GLUCOSE, CAPILLARY
Glucose-Capillary: 194 mg/dL — ABNORMAL HIGH (ref 70–99)
Glucose-Capillary: 154 mg/dL — ABNORMAL HIGH (ref 70–99)
Glucose-Capillary: 297 mg/dL — ABNORMAL HIGH (ref 70–99)
Glucose-Capillary: 200 mg/dL — ABNORMAL HIGH (ref 70–99)

## 2019-07-08 LAB — URINE DRUG SCREEN, QUALITATIVE (ARMC ONLY)
Amphetamines, Ur Screen: NOT DETECTED
Barbiturates, Ur Screen: NOT DETECTED
Benzodiazepine, Ur Scrn: NOT DETECTED
Cannabinoid 50 Ng, Ur ~~LOC~~: NOT DETECTED
Cocaine Metabolite,Ur ~~LOC~~: NOT DETECTED
MDMA (Ecstasy)Ur Screen: NOT DETECTED
Methadone Scn, Ur: NOT DETECTED
Opiate, Ur Screen: POSITIVE — AB
Phencyclidine (PCP) Ur S: NOT DETECTED
Tricyclic, Ur Screen: NOT DETECTED

## 2019-07-08 LAB — CBC
HCT: 49.2 % (ref 39.0–52.0)
Hemoglobin: 17.4 g/dL — ABNORMAL HIGH (ref 13.0–17.0)
MCH: 31.6 pg (ref 26.0–34.0)
MCHC: 35.4 g/dL (ref 30.0–36.0)
MCV: 89.5 fL (ref 80.0–100.0)
Platelets: 160 10*3/uL (ref 150–400)
RBC: 5.5 MIL/uL (ref 4.22–5.81)
RDW: 12.8 % (ref 11.5–15.5)
WBC: 8.3 10*3/uL (ref 4.0–10.5)
nRBC: 0 % (ref 0.0–0.2)

## 2019-07-08 LAB — HEPATIC FUNCTION PANEL
ALT: 28 U/L (ref 0–44)
AST: 34 U/L (ref 15–41)
Albumin: 4.2 g/dL (ref 3.5–5.0)
Alkaline Phosphatase: 104 U/L (ref 38–126)
Bilirubin, Direct: 0.3 mg/dL — ABNORMAL HIGH (ref 0.0–0.2)
Indirect Bilirubin: 1.1 mg/dL — ABNORMAL HIGH (ref 0.3–0.9)
Total Bilirubin: 1.4 mg/dL — ABNORMAL HIGH (ref 0.3–1.2)
Total Protein: 7.4 g/dL (ref 6.5–8.1)

## 2019-07-08 LAB — BASIC METABOLIC PANEL
Anion gap: 13 (ref 5–15)
BUN: 12 mg/dL (ref 6–20)
CO2: 22 mmol/L (ref 22–32)
Calcium: 9 mg/dL (ref 8.9–10.3)
Chloride: 105 mmol/L (ref 98–111)
Creatinine, Ser: 1.05 mg/dL (ref 0.61–1.24)
GFR calc Af Amer: >60 mL/min (ref >60)
GFR calc non Af Amer: >60 mL/min (ref >60)
Glucose, Bld: 260 mg/dL — ABNORMAL HIGH (ref 70–99)
Potassium: 4.2 mmol/L (ref 3.5–5.1)
Sodium: 140 mmol/L (ref 135–145)

## 2019-07-08 LAB — LIPID PANEL
Cholesterol: 227 mg/dL — ABNORMAL HIGH (ref 0–200)
HDL: 25 mg/dL — ABNORMAL LOW (ref >40)
LDL Cholesterol: UNDETERMINED mg/dL (ref 0–99)
Total CHOL/HDL Ratio: 9.1 RATIO
Triglycerides: 799 mg/dL — ABNORMAL HIGH (ref <150)
VLDL: UNDETERMINED mg/dL (ref 0–40)

## 2019-07-08 LAB — APTT: aPTT: 30 seconds (ref 24–36)

## 2019-07-08 LAB — PROTIME-INR
INR: 0.9 (ref 0.8–1.2)
Prothrombin Time: 11.5 seconds (ref 11.4–15.2)

## 2019-07-08 LAB — URINALYSIS, COMPLETE (UACMP) WITH MICROSCOPIC
Bacteria, UA: NONE SEEN
Bilirubin Urine: NEGATIVE
Glucose, UA: 150 mg/dL — AB
Hgb urine dipstick: NEGATIVE
Ketones, ur: NEGATIVE mg/dL
Nitrite: NEGATIVE
Protein, ur: 30 mg/dL — AB
Specific Gravity, Urine: >1.046 — ABNORMAL HIGH (ref 1.005–1.030)
pH: 5 (ref 5.0–8.0)

## 2019-07-08 LAB — HIV ANTIBODY (ROUTINE TESTING W REFLEX): HIV Screen 4th Generation wRfx: NONREACTIVE

## 2019-07-08 LAB — HEPARIN LEVEL (UNFRACTIONATED)
Heparin Unfractionated: 0.11 IU/mL — ABNORMAL LOW (ref 0.30–0.70)
Heparin Unfractionated: 0.25 [IU]/mL — ABNORMAL LOW (ref 0.30–0.70)

## 2019-07-08 LAB — LDL CHOLESTEROL, DIRECT: Direct LDL: 102.8 mg/dL — ABNORMAL HIGH (ref 0–99)

## 2019-07-08 LAB — HEMOGLOBIN A1C
Hgb A1c MFr Bld: 7.6 % — ABNORMAL HIGH (ref 4.8–5.6)
Mean Plasma Glucose: 171.42 mg/dL

## 2019-07-08 LAB — SARS CORONAVIRUS 2 BY RT PCR (HOSPITAL ORDER, PERFORMED IN ~~LOC~~ HOSPITAL LAB): SARS Coronavirus 2: NEGATIVE

## 2019-07-08 LAB — TSH: TSH: 1.348 u[IU]/mL (ref 0.350–4.500)

## 2019-07-08 LAB — LIPASE, BLOOD: Lipase: 30 U/L (ref 11–51)

## 2019-07-08 LAB — TROPONIN I (HIGH SENSITIVITY)
Troponin I (High Sensitivity): 115 ng/L (ref <18)
Troponin I (High Sensitivity): 309 ng/L (ref <18)

## 2019-07-08 MED ORDER — SENNOSIDES-DOCUSATE SODIUM 8.6-50 MG PO TABS
1.0000 | ORAL_TABLET | Freq: Every evening | ORAL | Status: DC | PRN
  Filled 2019-07-08: qty 1

## 2019-07-08 MED ORDER — ESMOLOL HCL 100 MG/10ML IV SOLN
150.0000 ug/kg/min | INTRAVENOUS | Status: DC
  Administered 2019-07-08: 150 ug/kg/min via INTRAVENOUS
  Filled 2019-07-08 (×8): qty 250

## 2019-07-08 MED ORDER — NICARDIPINE HCL IN NACL 20-0.86 MG/200ML-% IV SOLN: 3.0000 mg/h | INTRAVENOUS | Status: DC

## 2019-07-08 MED ORDER — HEPARIN SODIUM (PORCINE) 5000 UNIT/ML IJ SOLN
4000.0000 [IU] | Freq: Once | INTRAMUSCULAR | Status: AC
  Administered 2019-07-08: 4000 [IU] via INTRAVENOUS
  Filled 2019-07-08: qty 1

## 2019-07-08 MED ORDER — GABAPENTIN 300 MG PO CAPS
600.0000 mg | ORAL_CAPSULE | Freq: Two times a day (BID) | ORAL | Status: DC
  Administered 2019-07-08 – 2019-07-09 (×3): 600 mg via ORAL
  Filled 2019-07-08 (×4): qty 2
  Filled 2019-07-08: qty 6

## 2019-07-08 MED ORDER — INSULIN GLARGINE 100 UNIT/ML ~~LOC~~ SOLN
20.0000 [IU] | Freq: Every day | SUBCUTANEOUS | Status: DC
  Administered 2019-07-08 – 2019-07-09 (×2): 20 [IU] via SUBCUTANEOUS
  Filled 2019-07-08 (×3): qty 0.2

## 2019-07-08 MED ORDER — LABETALOL HCL 5 MG/ML IV SOLN
20.0000 mg | Freq: Once | INTRAVENOUS | Status: DC
  Filled 2019-07-08: qty 4

## 2019-07-08 MED ORDER — AMLODIPINE BESYLATE 10 MG PO TABS
10.0000 mg | ORAL_TABLET | ORAL | Status: DC
  Administered 2019-07-08 – 2019-07-09 (×2): 10 mg via ORAL
  Filled 2019-07-08: qty 1
  Filled 2019-07-08: qty 2

## 2019-07-08 MED ORDER — ONDANSETRON HCL 4 MG PO TABS
4.0000 mg | ORAL_TABLET | Freq: Four times a day (QID) | ORAL | Status: DC | PRN
  Filled 2019-07-08: qty 1

## 2019-07-08 MED ORDER — HYDROMORPHONE HCL 1 MG/ML IJ SOLN
1.0000 mg | INTRAMUSCULAR | Status: AC
  Administered 2019-07-08: 1 mg via INTRAVENOUS

## 2019-07-08 MED ORDER — SERTRALINE HCL 50 MG PO TABS
50.0000 mg | ORAL_TABLET | Freq: Every day | ORAL | Status: DC
  Administered 2019-07-09: 50 mg via ORAL
  Filled 2019-07-08 (×2): qty 1

## 2019-07-08 MED ORDER — ESMOLOL HCL-SODIUM CHLORIDE 2000 MG/100ML IV SOLN
25.0000 ug/kg/min | INTRAVENOUS | Status: DC
  Administered 2019-07-08: 150 ug/kg/min via INTRAVENOUS
  Filled 2019-07-08 (×2): qty 100

## 2019-07-08 MED ORDER — NICARDIPINE HCL IN NACL 20-0.86 MG/200ML-% IV SOLN
3.0000 mg/h | INTRAVENOUS | Status: DC
  Administered 2019-07-08: 7.5 mg/h via INTRAVENOUS
  Administered 2019-07-08: 06:00:00 5 mg/h via INTRAVENOUS
  Filled 2019-07-08 (×2): qty 200

## 2019-07-08 MED ORDER — GABAPENTIN 300 MG PO CAPS: 600.0000 mg | ORAL_CAPSULE | Freq: Three times a day (TID) | ORAL | Status: DC

## 2019-07-08 MED ORDER — INSULIN ASPART 100 UNIT/ML ~~LOC~~ SOLN
0.0000 [IU] | Freq: Three times a day (TID) | SUBCUTANEOUS | Status: DC
  Administered 2019-07-08: 8 [IU] via SUBCUTANEOUS
  Administered 2019-07-09 (×2): 3 [IU] via SUBCUTANEOUS
  Filled 2019-07-08 (×3): qty 1

## 2019-07-08 MED ORDER — ATORVASTATIN CALCIUM 20 MG PO TABS
20.0000 mg | ORAL_TABLET | Freq: Every day | ORAL | Status: DC
  Administered 2019-07-08 – 2019-07-09 (×2): 20 mg via ORAL
  Filled 2019-07-08 (×2): qty 1

## 2019-07-08 MED ORDER — BISACODYL 5 MG PO TBEC
5.0000 mg | DELAYED_RELEASE_TABLET | Freq: Every day | ORAL | Status: DC | PRN
  Filled 2019-07-08: qty 1

## 2019-07-08 MED ORDER — SODIUM CHLORIDE 0.9 % IV SOLN
INTRAVENOUS | Status: AC
  Administered 2019-07-08 (×2): via INTRAVENOUS

## 2019-07-08 MED ORDER — ACETAMINOPHEN 325 MG PO TABS: 650.0000 mg | ORAL_TABLET | Freq: Four times a day (QID) | ORAL | Status: DC | PRN

## 2019-07-08 MED ORDER — INSULIN ASPART 100 UNIT/ML ~~LOC~~ SOLN: 0.0000 [IU] | Freq: Every day | SUBCUTANEOUS | Status: DC

## 2019-07-08 MED ORDER — LISINOPRIL 20 MG PO TABS
40.0000 mg | ORAL_TABLET | Freq: Every day | ORAL | Status: DC
  Administered 2019-07-08 – 2019-07-09 (×2): 40 mg via ORAL
  Filled 2019-07-08: qty 4
  Filled 2019-07-08 (×2): qty 2

## 2019-07-08 MED ORDER — GABAPENTIN 300 MG PO CAPS: 900.0000 mg | ORAL_CAPSULE | Freq: Every day | ORAL | Status: DC

## 2019-07-08 MED ORDER — HEPARIN SODIUM (PORCINE) 5000 UNIT/ML IJ SOLN
3000.0000 [IU] | Freq: Once | INTRAMUSCULAR | Status: AC
  Administered 2019-07-08: 3000 [IU] via INTRAVENOUS

## 2019-07-08 MED ORDER — HEPARIN (PORCINE) 25000 UT/250ML-% IV SOLN
1900.0000 [IU]/h | INTRAVENOUS | Status: DC
  Administered 2019-07-08: 1350 [IU]/h via INTRAVENOUS
  Administered 2019-07-08: 1700 [IU]/h via INTRAVENOUS
  Filled 2019-07-08 (×3): qty 250

## 2019-07-08 MED ORDER — PANTOPRAZOLE SODIUM 40 MG PO TBEC
40.0000 mg | DELAYED_RELEASE_TABLET | Freq: Every day | ORAL | Status: DC
  Administered 2019-07-08 – 2019-07-09 (×2): 40 mg via ORAL
  Filled 2019-07-08 (×2): qty 1

## 2019-07-08 MED ORDER — HYDROMORPHONE HCL 1 MG/ML IJ SOLN
INTRAMUSCULAR | Status: AC
  Filled 2019-07-08: qty 1

## 2019-07-08 MED ORDER — CHLORTHALIDONE 25 MG PO TABS: 12.5000 mg | ORAL_TABLET | Freq: Every morning | ORAL | Status: DC

## 2019-07-08 MED ORDER — TAMSULOSIN HCL 0.4 MG PO CAPS
0.4000 mg | ORAL_CAPSULE | Freq: Every day | ORAL | Status: DC
  Administered 2019-07-08 – 2019-07-09 (×2): 0.4 mg via ORAL
  Filled 2019-07-08 (×3): qty 1

## 2019-07-08 MED ORDER — HEPARIN BOLUS VIA INFUSION
1500.0000 [IU] | Freq: Once | INTRAVENOUS | Status: AC
  Administered 2019-07-08: 1500 [IU] via INTRAVENOUS
  Filled 2019-07-08: qty 1500

## 2019-07-08 MED ORDER — ALBUTEROL SULFATE (2.5 MG/3ML) 0.083% IN NEBU: 3.0000 mL | INHALATION_SOLUTION | RESPIRATORY_TRACT | Status: DC | PRN

## 2019-07-08 MED ORDER — INSULIN ASPART 100 UNIT/ML ~~LOC~~ SOLN
3.0000 [IU] | Freq: Three times a day (TID) | SUBCUTANEOUS | Status: DC
  Administered 2019-07-08 – 2019-07-09 (×4): 3 [IU] via SUBCUTANEOUS
  Filled 2019-07-08 (×4): qty 1

## 2019-07-08 MED ORDER — GABAPENTIN 300 MG PO CAPS: 600.0000 mg | ORAL_CAPSULE | Freq: Two times a day (BID) | ORAL | Status: DC

## 2019-07-08 MED ORDER — ONDANSETRON HCL 4 MG/2ML IJ SOLN
4.0000 mg | INTRAMUSCULAR | Status: AC
  Administered 2019-07-08: 4 mg via INTRAVENOUS

## 2019-07-08 MED ORDER — SUCRALFATE 1 GM/10ML PO SUSP
1.0000 g | Freq: Three times a day (TID) | ORAL | Status: DC
  Administered 2019-07-08 – 2019-07-09 (×3): 1 g via ORAL
  Filled 2019-07-08 (×8): qty 10

## 2019-07-08 MED ORDER — ESMOLOL HCL-SODIUM CHLORIDE 2000 MG/100ML IV SOLN
25.0000 ug/kg/min | INTRAVENOUS | Status: DC
  Filled 2019-07-08: qty 100

## 2019-07-08 MED ORDER — ACETAMINOPHEN 650 MG RE SUPP: 650.0000 mg | Freq: Four times a day (QID) | RECTAL | Status: DC | PRN

## 2019-07-08 MED ORDER — GABAPENTIN 300 MG PO CAPS
900.0000 mg | ORAL_CAPSULE | Freq: Every day | ORAL | Status: DC
  Administered 2019-07-08: 900 mg via ORAL
  Filled 2019-07-08 (×3): qty 3

## 2019-07-08 MED ORDER — IOHEXOL 350 MG/ML SOLN
100.0000 mL | Freq: Once | INTRAVENOUS | Status: AC | PRN
  Administered 2019-07-08: 100 mL via INTRAVENOUS

## 2019-07-08 MED ORDER — IPRATROPIUM-ALBUTEROL 0.5-2.5 (3) MG/3ML IN SOLN
3.0000 mL | Freq: Four times a day (QID) | RESPIRATORY_TRACT | Status: DC
  Administered 2019-07-08 – 2019-07-09 (×4): 3 mL via RESPIRATORY_TRACT
  Filled 2019-07-08 (×5): qty 3

## 2019-07-08 MED ORDER — ESMOLOL BOLUS VIA INFUSION
500.0000 ug/kg | Freq: Once | INTRAVENOUS | Status: DC
  Filled 2019-07-08: qty 53000

## 2019-07-08 MED ORDER — HYDROCODONE-ACETAMINOPHEN 5-325 MG PO TABS
1.0000 | ORAL_TABLET | ORAL | Status: DC | PRN
  Administered 2019-07-09: 2 via ORAL
  Filled 2019-07-08: qty 2

## 2019-07-08 MED ORDER — ONDANSETRON HCL 4 MG/2ML IJ SOLN: 4.0000 mg | Freq: Four times a day (QID) | INTRAMUSCULAR | Status: DC | PRN

## 2019-07-08 MED ORDER — ASPIRIN 81 MG PO CHEW
324.0000 mg | CHEWABLE_TABLET | Freq: Once | ORAL | Status: AC
  Administered 2019-07-08: 324 mg via ORAL
  Filled 2019-07-08: qty 4

## 2019-07-08 MED ORDER — LABETALOL HCL 5 MG/ML IV SOLN
20.0000 mg | Freq: Once | INTRAVENOUS | Status: AC
  Administered 2019-07-08: 20 mg via INTRAVENOUS

## 2019-07-08 MED ORDER — ONDANSETRON HCL 4 MG/2ML IJ SOLN
INTRAMUSCULAR | Status: AC
  Filled 2019-07-08: qty 2

## 2019-07-08 NOTE — ED Triage Notes (Signed)
Patient with complaint of left side chest pain radiating down left arm and into his jaw that started about 22:00 last night. Patient states that the pain has become much more sever. Patient diaphoretic and states that he vomited on the way to the hospital.

## 2019-07-08 NOTE — ED Notes (Addendum)
Pt reporting he wants to leave.  Pt unhappy that wife cannot leave and come back. Explained this is the new policy r/t covid at this time.  Pt also wanting his gabapentin now.  Informed dr Reesa Chew of this as well as pt possibly wanting to leave.  Pt not leaving his O2 in nose. sats keep decreasing 87-90.  Pt very agitated and fidgety.

## 2019-07-08 NOTE — ED Notes (Signed)
Assisted pt to bathroom

## 2019-07-08 NOTE — ED Notes (Signed)
Pt states SOB, oxygen saturation 92% RA.  Pt placed on 2L [place] for comfort, MD notified.

## 2019-07-08 NOTE — Consult Note (Signed)
ANTICOAGULATION CONSULT NOTE - Initial Consult  Pharmacy Consult for Heparin Drip Indication: chest pain/ACS/STEMI  Allergies  Allergen Reactions  . Shellfish Allergy     Patient Measurements: Height: 6' (182.9 cm) Weight: 230 lb (104.3 kg) IBW/kg (Calculated) : 77.6 Heparin Dosing Weight: 99.2kg  Vital Signs: Temp: 98.2 F (36.8 C) (11/30 0500) Temp Source: Oral (11/30 0500) BP: 132/91 (11/30 0645) Pulse Rate: 85 (11/30 0645)  Labs: Recent Labs    07/08/19 0513  HGB 17.4*  HCT 49.2  PLT 160  CREATININE 1.05  TROPONINIHS 115*    Estimated Creatinine Clearance: 96.9 mL/min (by C-G formula based on SCr of 1.05 mg/dL).   Medical History: Past Medical History:  Diagnosis Date  . Cancer (HCC)    Hx of skin cancer  . COPD (chronic obstructive pulmonary disease) ([place])   . Diabetes mellitus without complication ([place])   . Hypertension   . Renal disorder    kidney stones    Medications:  No PTA anticoagulation of record - pt currently on esmolol infusion and nicardipine drip.   Assessment: 58 yo male complaining of left side chest pain radiating down left arm and into his jaw that started about 22:00 last night.  Troponin 115 ng/L, now with diaphoresis and vomiting, pharmacy has been consulted for a heparin drip for ACS/STEMI.      Goal of Therapy:  Heparin level 0.3-0.7 units/ml Monitor platelets by anticoagulation protocol: Yes   Plan:  Will give heparin via bolus 4000 units, followed by 1350 units/hr.  Will follow heparin level (HL) in 6 hours per protocol.  Daily CBC's per protocol while on heparin drip.  Lu Duffel, PharmD, BCPS Clinical Pharmacist 07/08/2019 7:14 AM

## 2019-07-08 NOTE — ED Notes (Signed)
Pt finishing food tray on arrival to room 31. States when hospital dinner tray gets here he doesn't want it.

## 2019-07-08 NOTE — Consult Note (Deleted)
ANTICOAGULATION CONSULT NOTE - Initial Consult  Pharmacy Consult for Heparin Drip Indication: chest pain/ACS/STEMI  Allergies  Allergen Reactions  . Shellfish Allergy Hives  . Statins Other (See Comments)    Intolerance noted 10/15/08- frequent cramps    Patient Measurements: Height: 6' (182.9 cm) Weight: 230 lb (104.3 kg) IBW/kg (Calculated) : 77.6 Heparin Dosing Weight: 99.2kg  Vital Signs: Temp: 98.2 F (36.8 C) (11/30 0500) Temp Source: Oral (11/30 0500) BP: 120/81 (11/30 1415) Pulse Rate: 88 (11/30 1415)  Labs: Recent Labs    07/08/19 0513 07/08/19 0518 07/08/19 0736 07/08/19 1416  HGB 17.4*  --   --   --   HCT 49.2  --   --   --   PLT 160  --   --   --   APTT  --  30  --   --   LABPROT  --  11.5  --   --   INR  --  0.9  --   --   HEPARINUNFRC  --   --   --  0.11*  CREATININE 1.05  --   --   --   TROPONINIHS 115*  --  309*  --     Estimated Creatinine Clearance: 96.9 mL/min (by C-G formula based on SCr of 1.05 mg/dL).   Medical History: Past Medical History:  Diagnosis Date  . Cancer (HCC)    Hx of skin cancer  . COPD (chronic obstructive pulmonary disease) ([place])   . Diabetes mellitus without complication ([place])   . Hypertension   . Renal disorder    kidney stones    Medications:  No PTA anticoagulation of record   Assessment: 58 yo male complaining of left side chest pain radiating down left arm and into his jaw that started about 22:00 last night.  Troponin 309 ng/L, now with diaphoresis and vomiting, pharmacy has been consulted for a heparin drip for ACS/STEMI.      Pt received 4000 units bolus followed by 1350 units/hr   11/30 1416 HL 0.11   Goal of Therapy:  Heparin level 0.3-0.7 units/ml Monitor platelets by anticoagulation protocol: Yes   Plan:  Heparin level is subtherapeutic. Will give heparin via bolus 3000 units, followed increasing rate to 1600 units/hr.  Will follow heparin level (HL) in 6 hours per protocol.  Daily CBC's per  protocol while on heparin drip.  Oswald Hillock, PharmD, BCPS Clinical Pharmacist 07/08/2019 3:01 PM

## 2019-07-08 NOTE — ED Notes (Signed)
Have offered wife something to drink multiple times, remains to decline. Wife given blanket. NAD at this time. Pt waiting on admit bed. Informed meal tray should come soon, declined crackers previously offered.  No chest pain.

## 2019-07-08 NOTE — ED Notes (Addendum)
Esmolol not loaded in pyxis.  Called pharmacy who states did not have premix and would have to hand mix and send to ED.  Pharmacy had to mix medication which became different concentration from guardrail in alaris pump.  Per Nicki Reaper, pharmacist, equivalent rate for medication given is 366mcg/kg/min d/t concentration being 10mg /mL in 250mL volume.  80mg , or 28mL, bolus was pulled from bag and given IV push over 30 seconds per [employer] verification.

## 2019-07-08 NOTE — Plan of Care (Signed)
  Problem: Activity: Goal: Ability to tolerate increased activity will improve Outcome: Progressing   Problem: Cardiac: Goal: Ability to achieve and maintain adequate cardiovascular perfusion will improve Outcome: Progressing   Problem: Education: Goal: Knowledge of General Education information will improve Description: Including pain rating scale, medication(s)/side effects and non-pharmacologic comfort measures Outcome: Progressing   Problem: Clinical Measurements: Goal: Respiratory complications will improve Outcome: Progressing Goal: Cardiovascular complication will be avoided Outcome: Progressing   Problem: Safety: Goal: Ability to remain free from injury will improve Outcome: Progressing   Problem: Skin Integrity: Goal: Risk for impaired skin integrity will decrease Outcome: Progressing

## 2019-07-08 NOTE — Consult Note (Signed)
[facility] Consultation Note  Patient ID: Brandon Johnston, MRN: [id number], DOB/AGE: Dec 15, 1960 58 y.o. Admit date: 07/08/2019   Date of Consult: 07/08/2019 Primary Physician: System, Pcp Not In Primary Cardiologist: Nehemiah Massed  Chief Complaint:  Chief Complaint  Patient presents with  . Chest Pain   Reason for Consult: Chest pain  HPI: 58 y.o. male with chronic obstructive pulmonary disease essential hypertension mixed hyperlipidemia and tobacco abuse for which the patient has been on appropriate medication management.  The patient has had cardiac catheterization in 2019 showing normal coronary arteries without evidence of stenosis.  In addition to that the patient has had intermittent episodes of weakness fatigue and dizziness when blood pressure has been treated with antihypertensives.  The patient did have waxing and waning of blood pressure with this treatment.  The patient also has had new onset of severe substernal chest discomfort radiating into his back and arms intense for quite some time.  This was relieved by antihypertensives as well as treatment for pain including morphine.  Patient now is pain-free.  EKG showed normal sinus rhythm and no evidence of changes and troponin peak is 115.  Currently he is feeling well and hemodynamically stable.  With his previous cardiac catheterization showing normal coronary arteries we will follow closely of course for other concerns including aortic dissection.  The patient did have a CT scan which did not show any aortic dissection or other changes  Past Medical History:  Diagnosis Date  . Cancer (HCC)    Hx of skin cancer  . COPD (chronic obstructive pulmonary disease) ([place])   . Diabetes mellitus without complication ([place])   . Hypertension   . Renal disorder    kidney stones      Surgical History:  Past Surgical History:  Procedure Laterality Date  . LEFT HEART CATH AND CORONARY ANGIOGRAPHY N/A 05/24/2018   Procedure:  LEFT HEART CATH AND CORONARY ANGIOGRAPHY;  Surgeon: Corey Skains, MD;  Location: [facility];  Service: Cardiovascular;  Laterality: N/A;     Home Meds: Prior to Admission medications   Medication Sig Start Date End Date Taking? Authorizing Provider  albuterol (PROVENTIL HFA;VENTOLIN HFA) 108 (90 Base) MCG/ACT inhaler Inhale 2 puffs into the lungs every 4 (four) hours as needed. 10/28/16   [provider]  amLODipine (NORVASC) 10 MG tablet Take 10 mg by mouth every morning. 03/18/18   [provider]  atorvastatin (LIPITOR) 20 MG tablet Take 1 tablet (20 mg total) by mouth daily. 05/25/18 05/25/19  Vaughan Basta, MD  chlorthalidone (HYGROTON) 25 MG tablet Take 12.5 mg by mouth every morning. 05/17/18   [provider]  gabapentin (NEURONTIN) 300 MG capsule Take 2-3 capsules by mouth 3 (three) times daily. 600 mg qam 600 mg noon 900 mg qhs 05/13/18   [provider]  glipiZIDE (GLUCOTROL) 10 MG tablet Take 10 mg by mouth daily. 03/23/18   [provider]  Insulin Glargine (LANTUS) 100 UNIT/ML Solostar Pen Inject 20 Units into the skin daily. 05/25/18   Vaughan Basta, MD  Insulin Pen Needle 32G X 4 MM MISC 1 each by Does not apply route every morning. 05/25/18   Vaughan Basta, MD  Ipratropium-Albuterol (COMBIVENT) 20-100 MCG/ACT AERS respimat Inhale 1 puff into the lungs 4 (four) times daily. 06/02/17   [provider]  lisinopril (PRINIVIL,ZESTRIL) 40 MG tablet Take 40 mg by mouth daily. 04/23/18   [provider]  metFORMIN (GLUCOPHAGE) 1000 MG tablet Take 1,000 mg by  mouth 2 (two) times daily with a meal. 05/04/18   [provider]  omeprazole (PRILOSEC) 20 MG capsule Take 1 capsule by mouth daily. 04/30/18   [provider]  sertraline (ZOLOFT) 100 MG tablet Take 50 mg by mouth daily. 05/17/18   [provider]  sucralfate (CARAFATE) 1 GM/10ML suspension Take 10 mLs (1 g  total) by mouth 4 (four) times daily -  with meals and at bedtime. 05/25/18   Vaughan Basta, MD    Inpatient Medications:  . amLODipine  10 mg Oral [id number]  . atorvastatin  20 mg Oral Daily  . esmolol  500 mcg/kg Intravenous Once  . gabapentin  600-900 mg Oral TID  . insulin aspart  0-15 Units Subcutaneous TID WC  . insulin aspart  0-5 Units Subcutaneous QHS  . insulin aspart  3 Units Subcutaneous TID WC  . Insulin Glargine  20 Units Subcutaneous Daily  . Ipratropium-Albuterol  1 puff Inhalation QID  . lisinopril  40 mg Oral Daily  . pantoprazole  40 mg Oral Daily  . sertraline  50 mg Oral Daily  . sucralfate  1 g Oral TID WC & HS  . tamsulosin  0.4 mg Oral QPC breakfast   . sodium chloride    . esmolol 150 mcg/kg/min (07/08/19 0655)  . heparin 1,350 Units/hr (07/08/19 0734)  . niCARDipine 5 mg/hr (07/08/19 0542)    Allergies:  Allergies  Allergen Reactions  . Shellfish Allergy     Social History   Socioeconomic History  . Marital status: Married    Spouse name: Not on file  . Number of children: Not on file  . Years of education: Not on file  . Highest education level: Not on file  Occupational History  . Not on file  Social Needs  . Financial resource strain: Not on file  . Food insecurity    Worry: Not on file    Inability: Not on file  . Transportation needs    Medical: Not on file    Non-medical: Not on file  Tobacco Use  . Smoking status: Current Every Day Smoker  . Smokeless tobacco: Never Used  Substance and Sexual Activity  . Alcohol use: Never    Frequency: Never  . Drug use: Never  . Sexual activity: Not on file  Lifestyle  . Physical activity    Days per week: Not on file    Minutes per session: Not on file  . Stress: Not on file  Relationships  . Social Herbalist on phone: Not on file    Gets together: Not on file    Attends religious service: Not on file    Active member of club or organization: Not on file     Attends meetings of clubs or organizations: Not on file    Relationship status: Not on file  . Intimate partner violence    Fear of current or ex partner: Not on file    Emotionally abused: Not on file    Physically abused: Not on file    Forced sexual activity: Not on file  Other Topics Concern  . Not on file  Social History Narrative  . Not on file     Family History  Problem Relation Age of Onset  . CAD Father   . CAD Maternal Grandfather   . CAD Paternal Grandfather      Review of Systems Positive for chest pain Negative for: General:  chills, fever, night sweats or  weight changes.  Cardiovascular: PND orthopnea syncope dizziness  Dermatological skin lesions rashes Respiratory: Cough congestion Urologic: Frequent urination urination at night and hematuria Abdominal: negative for nausea, vomiting, diarrhea, bright red blood per rectum, melena, or hematemesis Neurologic: negative for visual changes, and/or hearing changes  All other systems reviewed and are otherwise negative except as noted above.  Labs: No results for input(s): CKTOTAL, CKMB, TROPONINI in the last 72 hours. Lab Results  Component Value Date   WBC 8.3 07/08/2019   HGB 17.4 (H) 07/08/2019   HCT 49.2 07/08/2019   MCV 89.5 07/08/2019   PLT 160 07/08/2019    Recent Labs  Lab 07/08/19 0513  NA 140  K 4.2  CL 105  CO2 22  BUN 12  CREATININE 1.05  CALCIUM 9.0  PROT 7.4  BILITOT 1.4*  ALKPHOS 104  ALT 28  AST 34  GLUCOSE 260*   Lab Results  Component Value Date   CHOL 223 (H) 05/25/2018   HDL 26 (L) 05/25/2018   LDLCALC UNABLE TO CALCULATE IF TRIGLYCERIDE OVER 400 mg/dL 05/25/2018   TRIG 617 (H) 05/25/2018   No results found for: DDIMER  Radiology/Studies:  Dg Chest Portable 1 View  Result Date: 07/08/2019 CLINICAL DATA:  Chest pain. EXAM: PORTABLE CHEST 1 VIEW COMPARISON:  05/24/2018. FINDINGS: Mediastinum hilar structures normal. Heart size normal. No pulmonary venous congestion.  No focal infiltrate. No pleural effusion or pneumothorax. IMPRESSION: No acute cardiopulmonary disease.  Exam stable from prior exam. Electronically Signed   By: [place]   On: 07/08/2019 05:45   Ct Angio Chest/abd/pel For Dissection W And/or W/wo  Result Date: 07/08/2019 CLINICAL DATA:  Chest pain began at 2200 hours last evening and has progressed. Acute aortic disease suspected. EXAM: CT ANGIOGRAPHY CHEST, ABDOMEN AND PELVIS TECHNIQUE: Multidetector CT imaging through the chest, abdomen and pelvis was performed using the standard protocol during bolus administration of intravenous contrast. Multiplanar reconstructed images and MIPs were obtained and reviewed to evaluate the vascular anatomy. CONTRAST:  147mL OMNIPAQUE IOHEXOL 350 MG/ML SOLN COMPARISON:  One-view chest x-ray 07/08/2019 FINDINGS: CTA CHEST FINDINGS Cardiovascular: The heart size is normal. No significant coronary artery calcifications are present. Aorta and great vessel origins are within normal limits. Pulmonary arteries are within normal limits. Focal filling defects are present to suggest pulmonary embolus. Mediastinum/Nodes: Subcentimeter paratracheal lymph nodes are likely reactive. No significant mediastinal, hilar, or axillary adenopathy is present. Esophagus is normal. Thoracic inlet is within normal limits. Lungs/Pleura: The lungs are clear without focal nodule, mass, or airspace disease. No significant pleural effusion or pneumothorax is present. Musculoskeletal: Vertebral body heights and alignment are maintained. No focal lytic or blastic lesions are present. Review of the MIP images confirms the above findings. CTA ABDOMEN AND PELVIS FINDINGS VASCULAR Aorta: Atherosclerotic irregularity is present, abdominal along the posterior wall of the infrarenal aorta to the bifurcation without aneurysm or focal stenosis. Celiac: Patent without evidence of aneurysm, dissection, vasculitis or significant stenosis. SMA: Patent without  evidence of aneurysm, dissection, vasculitis or significant stenosis. Renals: Both renal arteries are patent without evidence of aneurysm, dissection, vasculitis, fibromuscular dysplasia or significant stenosis. IMA: Patent without evidence of aneurysm, dissection, vasculitis or significant stenosis. Inflow: Atherosclerotic irregularity is present through the bifurcation into the proximal common iliac arteries without significant stenosis or aneurysm. Veins: No obvious venous abnormality within the limitations of this arterial phase study. Review of the MIP images confirms the above findings. NON-VASCULAR Hepatobiliary: No focal liver abnormality is seen. No gallstones, gallbladder  wall thickening, or biliary dilatation. Pancreas: Unremarkable. No pancreatic ductal dilatation or surrounding inflammatory changes. Spleen: Normal in size without focal abnormality. Adrenals/Urinary Tract: Adrenal glands are normal bilaterally. A 17 mm nonobstructing stone is present at the upper pole of the left kidney. No other significant nephrolithiasis is present. There is no renal obstruction. Ureters are within normal limits bilaterally. The urinary bladder is normal. Stomach/Bowel: Stomach and duodenum are within normal limits. Small bowel is unremarkable. Terminal ileum is normal. Appendix is visualized and normal. Ascending and transverse colon are within normal limits. The descending and sigmoid colon are normal. Lymphatic: No significant retroperitoneal adenopathy is present. Reproductive: Prostate is unremarkable. Other: No abdominal wall hernia or abnormality. No abdominopelvic ascites. Musculoskeletal: No acute or significant osseous findings. Review of the MIP images confirms the above findings. IMPRESSION: 1. No aortic dissection or aneurysm. 2. No other significant vascular disease. 3. 17 mm nonobstructing stone at the upper pole of the left kidney. 4. No other acute or focal lesion to explain the patient's chest pain.  5. Aortic Atherosclerosis (ICD10-I70.0). Electronically Signed   By: [place] M.D.   On: 07/08/2019 06:40    EKG: Normal sinus rhythm  Weights: Filed Weights   07/08/19 0458  Weight: 104.3 kg     Physical Exam: Blood pressure (!) 132/91, pulse 85, temperature 98.2 F (36.8 C), temperature source Oral, resp. rate (!) 22, height 6' (1.829 m), weight 104.3 kg, SpO2 95 %. Body mass index is 31.19 kg/m. General: Well developed, well nourished, in no acute distress. Head eyes ears nose throat: Normocephalic, atraumatic, sclera non-icteric, no xanthomas, nares are without discharge. No apparent thyromegaly and/or mass  Lungs: Normal respiratory effort.  no wheezes, no rales, no rhonchi.  Heart: RRR with normal S1 S2. no murmur gallop, no rub, PMI is normal size and placement, carotid upstroke normal without bruit, jugular venous pressure is normal Abdomen: Soft, non-tender, non-distended with normoactive bowel sounds. No hepatomegaly. No rebound/guarding. No obvious abdominal masses. Abdominal aorta is normal size without bruit Extremities: No edema. no cyanosis, no clubbing, no ulcers  Peripheral : 2+ bilateral upper extremity pulses, 2+ bilateral femoral pulses, 2+ bilateral dorsal pedal pulse Neuro: Alert and oriented. No facial asymmetry. No focal deficit. Moves all extremities spontaneously. Musculoskeletal: Normal muscle tone without kyphosis Psych:  Responds to questions appropriately with a normal affect.    Assessment: 58 year old male with hypertension hyperlipidemia tobacco abuse with series of various substernal chest discomfort and no current evidence of acute coronary syndrome although elevated troponin of 115 and no evidence of dissection by CAT scan now improved with blood better blood pressure control  Plan: 1.  Continue antihypertensive medication management at this time without change to improve overall outcomes and symptoms 2.  Continue watching serial ECG  and enzymes to assess for possible myocardial infarction and/or acute coronary syndrome 3.  Patient was counseled on the discontinuation of tobacco abuse 4.  Further treatment options after above  Signed, Corey Skains M.D. [facility] 07/08/2019, 8:15 AM

## 2019-07-08 NOTE — Progress Notes (Signed)
Report called to dedra rn

## 2019-07-08 NOTE — ED Provider Notes (Signed)
[facility] Emergency Department Provider Note  ____________________________________________   First MD Initiated Contact with Patient 07/08/19 [phone]     (approximate)  I have reviewed the triage vital signs and the nursing notes.   HISTORY  Chief Complaint Chest Pain  Level 5 caveat:  history/ROS limited by acute/critical illness  HPI BUN RINNE is a 58 y.o. male his medical history includes COPD with ongoing heavy tobacco use, hypertension on only one medication (lisinopril), and history of kidney stones who presents by private vehicle for evaluation of severe and acute onset left-sided chest pain that is both sharp and pressure.  He says it radiates down his left arm and up into his jaw.  It started about 8 hours ago but was mild when it began and is steadily gotten worse and worse and is now severe.  The patient is in severe distress upon arrival, diaphoretic, having difficulty breathing due to the pain, and is nauseated and had at least one episode of emesis.  He had an episode of chest pain that was similar but milder a little over a year ago and they were told he did not have a heart attack.  He has no prior history of which he is aware of aortic disease.         Past Medical History:  Diagnosis Date   Cancer [facility])    Hx of skin cancer   COPD (chronic obstructive pulmonary disease) ([place])    Diabetes mellitus without complication ([place])    Hypertension    Renal disorder    kidney stones    Patient Active Problem List   Diagnosis Date Noted   Chest pain 05/24/2018    Past Surgical History:  Procedure Laterality Date   LEFT HEART CATH AND CORONARY ANGIOGRAPHY N/A 05/24/2018   Procedure: LEFT HEART CATH AND CORONARY ANGIOGRAPHY;  Surgeon: Corey Skains, MD;  Location: [facility];  Service: Cardiovascular;  Laterality: N/A;    Prior to Admission medications   Medication Sig Start Date End Date Taking? Authorizing  Provider  albuterol (PROVENTIL HFA;VENTOLIN HFA) 108 (90 Base) MCG/ACT inhaler Inhale 2 puffs into the lungs every 4 (four) hours as needed. 10/28/16   [provider]  amLODipine (NORVASC) 10 MG tablet Take 10 mg by mouth every morning. 03/18/18   [provider]  atorvastatin (LIPITOR) 20 MG tablet Take 1 tablet (20 mg total) by mouth daily. 05/25/18 05/25/19  Vaughan Basta, MD  chlorthalidone (HYGROTON) 25 MG tablet Take 12.5 mg by mouth every morning. 05/17/18   [provider]  gabapentin (NEURONTIN) 300 MG capsule Take 2-3 capsules by mouth 3 (three) times daily. 600 mg qam 600 mg noon 900 mg qhs 05/13/18   [provider]  glipiZIDE (GLUCOTROL) 10 MG tablet Take 10 mg by mouth daily. 03/23/18   [provider]  Insulin Glargine (LANTUS) 100 UNIT/ML Solostar Pen Inject 20 Units into the skin daily. 05/25/18   Vaughan Basta, MD  Insulin Pen Needle 32G X 4 MM MISC 1 each by Does not apply route every morning. 05/25/18   Vaughan Basta, MD  Ipratropium-Albuterol (COMBIVENT) 20-100 MCG/ACT AERS respimat Inhale 1 puff into the lungs 4 (four) times daily. 06/02/17   [provider]  lisinopril (PRINIVIL,ZESTRIL) 40 MG tablet Take 40 mg by mouth daily. 04/23/18   [provider]  metFORMIN (GLUCOPHAGE) 1000 MG tablet Take 1,000 mg by mouth 2 (two) times daily with a meal. 05/04/18   [provider]  omeprazole (PRILOSEC) 20 MG capsule Take 1 capsule by mouth daily. 04/30/18   [provider]  sertraline (ZOLOFT) 100 MG tablet Take 50 mg by mouth daily. 05/17/18   [provider]  sucralfate (CARAFATE) 1 GM/10ML suspension Take 10 mLs (1 g total) by mouth 4 (four) times daily -  with meals and at bedtime. 05/25/18   Vaughan Basta, MD    Allergies Shellfish allergy  Family History  Problem Relation Age of Onset   CAD Father    CAD Maternal Grandfather    CAD Paternal  Grandfather     Social History Social History   Tobacco Use   Smoking status: Current Every Day Smoker   Smokeless tobacco: Never Used  Substance Use Topics   Alcohol use: Never    Frequency: Never   Drug use: Never    Review of Systems Level 5 caveat:  history/ROS limited by acute/critical illness    ____________________________________________   PHYSICAL EXAM:  VITAL SIGNS: ED Triage Vitals  Enc Vitals Group     BP 07/08/19 0500 (!) 216/135     Pulse Rate 07/08/19 0500 (!) 111     Resp 07/08/19 0500 (!) 24     Temp 07/08/19 0500 98.2 F (36.8 C)     Temp Source 07/08/19 0500 Oral     SpO2 07/08/19 0515 92 %     Weight 07/08/19 0458 104.3 kg (230 lb)     Height 07/08/19 0458 1.829 m (6')     Head Circumference --      Peak Flow --      Pain Score --      Pain Loc --      Pain Edu? --      Excl. in [place]? --     Constitutional: Alert and oriented but in extremis due to pain. Eyes: Conjunctivae are normal.  Head: Atraumatic. Nose: No congestion/rhinnorhea. Mouth/Throat: Patient is wearing a mask. Neck: No stridor.  No meningeal signs.   Cardiovascular: Tachycardia, regular rhythm. Good peripheral circulation. Grossly normal heart sounds. Respiratory: Tachypnea, no coarse breath sounds or wheezing.  Increased respiratory effort. Gastrointestinal: Soft and nontender. No distention.  No abdominal bruits. Musculoskeletal: No lower extremity tenderness nor edema. No gross deformities of extremities. Neurologic:  Normal speech and language. No gross focal neurologic deficits are appreciated.  Skin:  Skin is warm, heavily diaphoretic, flushed.   ____________________________________________   LABS (all labs ordered are listed, but only abnormal results are displayed)  Labs Reviewed  BASIC METABOLIC PANEL - Abnormal; Notable for the following components:      Result Value   Glucose, Bld 260 (*)    All other components within normal limits  CBC - Abnormal;  Notable for the following components:   Hemoglobin 17.4 (*)    All other components within normal limits  HEPATIC FUNCTION PANEL - Abnormal; Notable for the following components:   Total Bilirubin 1.4 (*)    Bilirubin, Direct 0.3 (*)    Indirect Bilirubin 1.1 (*)    All other components within normal limits  URINE DRUG SCREEN, QUALITATIVE ([facility] ONLY) - Abnormal; Notable for the following components:   Opiate, Ur Screen POSITIVE (*)    All other components within normal limits  TROPONIN I (HIGH SENSITIVITY) - Abnormal; Notable for the following components:   Troponin I (High Sensitivity) 115 (*)    All other components within normal limits  LIPASE, BLOOD  HEPARIN LEVEL (UNFRACTIONATED)  APTT  PROTIME-INR  TROPONIN I (  HIGH SENSITIVITY)   ____________________________________________  EKG  ED ECG REPORT #1 I, Hinda Kehr, the attending physician, personally viewed and interpreted this ECG.  Date: 07/08/2019 EKG Time: 4:56 AM Rate: 109 Rhythm: Sinus tachycardia QRS Axis: Right axis deviation Intervals: normal ST/T Wave abnormalities: The patient has some ST depression globally but most notable in the lateral leads as well as in lead II.  The patient is in a significant amount of distress and unable to remain still so there is a great deal of artifact. Narrative Interpretation: Suggestive of ischemia but does not meet STEMI criteria  ED ECG REPORT #2 (after labetalol and on nicardipine infusion, improved chest pain but still present) I, Hinda Kehr, the attending physician, personally viewed and interpreted this ECG.  Date: 07/08/2019 EKG Time: 6:23 AM Rate: 92 Rhythm: normal sinus rhythm QRS Axis: Borderline right axis deviation Intervals: normal ST/T Wave abnormalities: ST depression is again noted in the lateral leads.  No ST segment elevation. Narrative Interpretation: no definitive evidence of acute ischemia but it is suggestive; does not meet STEMI  criteria.     ____________________________________________  RADIOLOGY I, Hinda Kehr, personally viewed and evaluated these images (plain radiographs) as part of my medical decision making, as well as reviewing the written report by the radiologist.  ED MD interpretation: No acute abnormalities identified on chest x-ray.  No evidence of dissection or other acute or emergent medical conditions on CTA chest/abdomen/pelvis.  Official radiology report(s): Dg Chest Portable 1 View  Result Date: 07/08/2019 CLINICAL DATA:  Chest pain. EXAM: PORTABLE CHEST 1 VIEW COMPARISON:  05/24/2018. FINDINGS: Mediastinum hilar structures normal. Heart size normal. No pulmonary venous congestion. No focal infiltrate. No pleural effusion or pneumothorax. IMPRESSION: No acute cardiopulmonary disease.  Exam stable from prior exam. Electronically Signed   By: Winston   On: 07/08/2019 05:45   Ct Angio Chest/abd/pel For Dissection W And/or W/wo  Result Date: 07/08/2019 CLINICAL DATA:  Chest pain began at 2200 hours last evening and has progressed. Acute aortic disease suspected. EXAM: CT ANGIOGRAPHY CHEST, ABDOMEN AND PELVIS TECHNIQUE: Multidetector CT imaging through the chest, abdomen and pelvis was performed using the standard protocol during bolus administration of intravenous contrast. Multiplanar reconstructed images and MIPs were obtained and reviewed to evaluate the vascular anatomy. CONTRAST:  165mL OMNIPAQUE IOHEXOL 350 MG/ML SOLN COMPARISON:  One-view chest x-ray 07/08/2019 FINDINGS: CTA CHEST FINDINGS Cardiovascular: The heart size is normal. No significant coronary artery calcifications are present. Aorta and great vessel origins are within normal limits. Pulmonary arteries are within normal limits. Focal filling defects are present to suggest pulmonary embolus. Mediastinum/Nodes: Subcentimeter paratracheal lymph nodes are likely reactive. No significant mediastinal, hilar, or axillary adenopathy is  present. Esophagus is normal. Thoracic inlet is within normal limits. Lungs/Pleura: The lungs are clear without focal nodule, mass, or airspace disease. No significant pleural effusion or pneumothorax is present. Musculoskeletal: Vertebral body heights and alignment are maintained. No focal lytic or blastic lesions are present. Review of the MIP images confirms the above findings. CTA ABDOMEN AND PELVIS FINDINGS VASCULAR Aorta: Atherosclerotic irregularity is present, abdominal along the posterior wall of the infrarenal aorta to the bifurcation without aneurysm or focal stenosis. Celiac: Patent without evidence of aneurysm, dissection, vasculitis or significant stenosis. SMA: Patent without evidence of aneurysm, dissection, vasculitis or significant stenosis. Renals: Both renal arteries are patent without evidence of aneurysm, dissection, vasculitis, fibromuscular dysplasia or significant stenosis. IMA: Patent without evidence of aneurysm, dissection, vasculitis or significant stenosis. Inflow: Atherosclerotic irregularity is  present through the bifurcation into the proximal common iliac arteries without significant stenosis or aneurysm. Veins: No obvious venous abnormality within the limitations of this arterial phase study. Review of the MIP images confirms the above findings. NON-VASCULAR Hepatobiliary: No focal liver abnormality is seen. No gallstones, gallbladder wall thickening, or biliary dilatation. Pancreas: Unremarkable. No pancreatic ductal dilatation or surrounding inflammatory changes. Spleen: Normal in size without focal abnormality. Adrenals/Urinary Tract: Adrenal glands are normal bilaterally. A 17 mm nonobstructing stone is present at the upper pole of the left kidney. No other significant nephrolithiasis is present. There is no renal obstruction. Ureters are within normal limits bilaterally. The urinary bladder is normal. Stomach/Bowel: Stomach and duodenum are within normal limits. Small bowel is  unremarkable. Terminal ileum is normal. Appendix is visualized and normal. Ascending and transverse colon are within normal limits. The descending and sigmoid colon are normal. Lymphatic: No significant retroperitoneal adenopathy is present. Reproductive: Prostate is unremarkable. Other: No abdominal wall hernia or abnormality. No abdominopelvic ascites. Musculoskeletal: No acute or significant osseous findings. Review of the MIP images confirms the above findings. IMPRESSION: 1. No aortic dissection or aneurysm. 2. No other significant vascular disease. 3. 17 mm nonobstructing stone at the upper pole of the left kidney. 4. No other acute or focal lesion to explain the patient's chest pain. 5. Aortic Atherosclerosis (ICD10-I70.0). Electronically Signed   By: San Morelle M.D.   On: 07/08/2019 06:40    ____________________________________________   PROCEDURES   Procedure(s) performed (including Critical Care):  .Critical Care Performed by: Hinda Kehr, MD Authorized by: Hinda Kehr, MD   Critical care provider statement:    Critical care time (minutes):  60   Critical care time was exclusive of:  Separately billable procedures and treating other patients   Critical care was necessary to treat or prevent imminent or life-threatening deterioration of the following conditions:  Cardiac failure (hypertensive emergency, chest pain with concern for aortic dissection and/or NSTEMI)   Critical care was time spent personally by me on the following activities:  Development of treatment plan with patient or surrogate, discussions with consultants, evaluation of patient's response to treatment, examination of patient, obtaining history from patient or surrogate, ordering and performing treatments and interventions, ordering and review of laboratory studies, ordering and review of radiographic studies, pulse oximetry, re-evaluation of patient's condition and review of old  charts     ____________________________________________   [place] / MDM / [place] / ED COURSE  As part of my medical decision making, I reviewed the following data within the electronic MEDICAL RECORD NUMBER History obtained from family, Nursing notes reviewed and incorporated, Labs reviewed , EKG interpreted , Old chart reviewed, Radiograph reviewed , Discussed with admitting physician , Discussed with radiologist, Notes from prior ED visits and [place] Controlled Substance Database   Differential diagnosis includes, but is not limited to, aortic dissection, ACS/NSTEMI, less likely acute infection or pneumothorax.  The patient's blood pressure is uncontrolled with an initial pressure of 216/135 and tachycardic at around 1 10-1 15.  He has a toxic appearance, clutching his chest and leaning forward, difficulty speaking due to the pain.  He is a long-term and heavy smoker with a history of hypertension and hypercholesterolemia.  He had a left heart catheterization a little over a year ago which did not require intervention.  I am most concerned about aortic dissection.  He has no history of renal dysfunction either way I believe we should scan him for the possibility of  dissection as soon as possible after starting him on medications.  For the speed of administration I have ordered labetalol 20 mg IV for rate control and ordered nicardipine 5 mg/h initial infusion rate with parameters for titration.  I have also ordered esmolol bolus plus infusion from the pharmacy.  I have instructed the nurses and the CT technologist to take the patient to scan as soon as he is received the labetalol and started on the nicardipine infusion.  As soon as the patient arrived I ordered Dilaudid 1 mg IV and Zofran 4 mg IV given his extreme distress and he says that he feels better after the Dilaudid but he is still profusely sweating and still feels the chest pressure and is still clutching his chest with his  fist.       Clinical Course as of Jul 07 749  [facility] Jul 08, 2019  [id number] Stable X ray from prior exam  DG Chest Portable 1 View [CF]  0549 HS-Troponin of 115   [CF]  0609 The patient is back from the CT scan.  Heart rate is still greater than 100 and his blood pressure is 181/110.  I asked the nurses to increase the nicardipine infusion to 7.5 mg IV and to go ahead and start the esmolol bolus plus infusion.  Awaiting imaging results from radiology.   [CF]  0610   Pain is still present but improved from prior.   [CF]  0618 Lipase: 30 [CF]  0619 Normal comprehensive metabolic panel  Basic metabolic panel(!) [CF]  [id number] Normal hepatic function panel except for very minimally elevated bilirubin, unlikely to be clinically relevant  Hepatic function panel(!) [CF]  [phone] No evidence of dissection or any other acute abnormalities identified on CTA chest.  However the patient still appears uncomfortable and is still symptomatic.  At a minimum he is having hypertensive emergency with demand ischemia, also possibly NSTEMI although I verified he had a clean catheterization last year.  Given no evidence of dissection but ongoing chest pain I have ordered a full dose aspirin and heparin bolus plus infusion.  It is almost 7 AM so we will call cardiology after shift change to discuss the case and then to admit the patient.  CT Angio Chest/Abd/Pel for Dissection W and/or W/WO [CF]  [id number] Now the patient is feeling better and his blood pressure is down to about [id number] systolic.  Chest pain has eased off significantly and is only minimal.  I have asked the nurses to keep the nicardipine at 7.5 mg/h and the esmolol running as an infusion.  I have also ordered a urine drug screen to rule out the possibility of cocaine contributing to the current scenario even though the patient has denied any drug use.   [CF]  [id number] Paging Dr. Nehemiah Massed to discuss   [CF]  [phone] I discussed the case by phone with Dr. Nehemiah Massed with  cardiology.  I explained the clinical picture and findings and he agreed with the current management.  He agrees with continuing the esmolol, nicardipine, and heparin drips.  He will see the patient this morning.  I am putting in a consult to the hospitalist service for admission.   [CF]  [id number] Marya Amsler the ED charge nurse will put in the order for the rapid COVID-19 swab for ICU admission   [CF]  0744 I discussed the case with the admitting doctor by phone.  He will admit to the ICU or stepdown as appropriate.   [CF]  Clinical Course User Index [CF] Hinda Kehr, MD     ____________________________________________  FINAL CLINICAL IMPRESSION(S) / ED DIAGNOSES  Final diagnoses:  Hypertensive emergency, no CHF  Chest pain, unspecified type  Elevated troponin level  Demand ischemia ([place])  Tobacco use     MEDICATIONS GIVEN DURING THIS VISIT:  Medications  esmolol (BREVIBLOC) bolus via infusion 52,150 mcg (52,150 mcg Intravenous Not Given 07/08/19 0658)  nicardipine (CARDENE) 20mg  in 0.86% saline 268ml IV infusion (0.1 mg/ml) (5 mg/hr Intravenous New Bag/Given 07/08/19 0542)  esmolol (BREVIBLOC) 2000 mg / 100 mL (20 mg/mL) infusion (150 mcg/kg/min  104.3 kg Intravenous New Bag/Given 07/08/19 0655)  aspirin chewable tablet 324 mg (has no administration in time range)  heparin ADULT infusion 100 units/mL (25000 units/24mL sodium chloride 0.45%) (1,350 Units/hr Intravenous New Bag/Given 07/08/19 0734)  HYDROmorphone (DILAUDID) injection 1 mg (1 mg Intravenous Given 07/08/19 0510)  ondansetron (ZOFRAN) injection 4 mg (4 mg Intravenous Given 07/08/19 0510)  labetalol (NORMODYNE) injection 20 mg (20 mg Intravenous Given 07/08/19 0538)  iohexol (OMNIPAQUE) 350 MG/ML injection 100 mL (100 mLs Intravenous Contrast Given 07/08/19 0548)  heparin injection 4,000 Units (4,000 Units Intravenous Given 07/08/19 0730)     ED Discharge Orders    None      *Please note:  GRETCHEN WATWOOD was  evaluated in Emergency Department on 07/08/2019 for the symptoms described in the history of present illness. He was evaluated in the context of the global COVID-19 pandemic, which necessitated consideration that the patient might be at risk for infection with the SARS-CoV-2 virus that causes COVID-19. Institutional protocols and algorithms that pertain to the evaluation of patients at risk for COVID-19 are in a state of rapid change based on information released by regulatory bodies including the CDC and federal and state organizations. These policies and algorithms were followed during the patient's care in the ED.  Some ED evaluations and interventions may be delayed as a result of limited staffing during the pandemic.*  Note:  This document was prepared using Dragon voice recognition software and may include unintentional dictation errors.   Hinda Kehr, MD 07/08/19 [phone]

## 2019-07-08 NOTE — ED Notes (Signed)
Called Pharmacy and requested next bag of Esmolol to be sent up. Pharmacy stated they would send up shortly

## 2019-07-08 NOTE — ED Notes (Signed)
Discontinued esmolol per Dr Reesa Chew verbal order at bedside

## 2019-07-08 NOTE — Consult Note (Signed)
ANTICOAGULATION CONSULT NOTE - Initial Consult  Pharmacy Consult for Heparin Drip Indication: chest pain/ACS/STEMI  Allergies  Allergen Reactions  . Shellfish Allergy Hives  . Statins Other (See Comments)    Intolerance noted 10/15/08- frequent cramps    Patient Measurements: Height: 6' (182.9 cm) Weight: 230 lb (104.3 kg) IBW/kg (Calculated) : 77.6 Heparin Dosing Weight: 99.2kg  Vital Signs: Temp: 98.2 F (36.8 C) (11/30 0500) Temp Source: Oral (11/30 0500) BP: 120/81 (11/30 1415) Pulse Rate: 88 (11/30 1415)  Labs: Recent Labs    07/08/19 0513 07/08/19 0518 07/08/19 0736 07/08/19 1416  HGB 17.4*  --   --   --   HCT 49.2  --   --   --   PLT 160  --   --   --   APTT  --  30  --   --   LABPROT  --  11.5  --   --   INR  --  0.9  --   --   HEPARINUNFRC  --   --   --  0.11*  CREATININE 1.05  --   --   --   TROPONINIHS 115*  --  309*  --     Estimated Creatinine Clearance: 96.9 mL/min (by C-G formula based on SCr of 1.05 mg/dL).   Medical History: Past Medical History:  Diagnosis Date  . Cancer (HCC)    Hx of skin cancer  . COPD (chronic obstructive pulmonary disease) ([place])   . Diabetes mellitus without complication ([place])   . Hypertension   . Renal disorder    kidney stones    Medications:  No PTA anticoagulation of record - pt currently on esmolol infusion and nicardipine drip.   Assessment: 58 yo male complaining of left side chest pain radiating down left arm and into his jaw that started about 22:00 last night.  Troponin 115 ng/L, now with diaphoresis and vomiting, pharmacy has been consulted for a heparin drip for ACS/STEMI.      11/30 0734 heparin via bolus 4000 units, followed by 1350 units/hr 11/30 1416 HL 0.11 - subtherapeutic (verified no interruptions in therapy with nursing)  Goal of Therapy:  Heparin level 0.3-0.7 units/ml Monitor platelets by anticoagulation protocol: Yes   Plan:  Will re-bolus 3000 units, and increase rate to 1700  units/hr.    Will follow heparin level (HL) in 6 hours per protocol.    Daily CBC's per protocol while on heparin drip.  Lu Duffel, PharmD, BCPS Clinical Pharmacist 07/08/2019 3:04 PM

## 2019-07-08 NOTE — H&P (Signed)
History and Physical    Brandon Johnston [id number] DOB: [birth year] DOA: 07/08/2019  PCP: System, Pcp Not In Patient coming from: Home  Chief Complaint: Chest pain  HPI: Brandon Johnston is a 58 y.o. male with medical history significant of tobacco use, essential hypertension, hyperlipidemia, DM 2, COPD presents to the hospital with complains of chest pain.  Patient states after dinner yesterday he started experiencing substernal burning-like chest pain radiating to up to his jaw.  At first it was much mild in intensity but it progressed throughout the night along with feeling of nausea and some shortness of breath.  Due to the symptoms he decided to come to the ER for further evaluation. He has had previous symptoms like this in the past for which he had left heart catheterization October 2019 which showed normal coronaries and normal echocardiogram.  He continues to smoke 1 pack of cigarettes daily, denies any alcohol and illicit drug use.  In the ER initially he was noted to have systolic blood pressure greater than 240, initial troponin around 100 which trended up to 300.  EKG was unremarkable.  CTA chest was done to rule out dissection which was negative but it showed 17 mm nonobstructive left-sided renal stone.  He was started on esmolol drip and nicardipine drip.  Case was discussed with cardiology by the ER provider.  Medical team was requested to admit the patient  When I saw the patient he was chest pain-free and felt much better.  His systolic blood pressure was in 130s with heart rate in the 70s therefore I advised nursing staff to discontinue esmolol drip.  With plans to slowly wean off nicardipine drip once he was administered oral medication.   Review of Systems: As per HPI otherwise 10 point review of systems negative.  Review of Systems Otherwise negative except as per HPI, including: General: Denies fever, chills, night sweats or unintended weight loss. Resp: Denies  cough, wheezing, Cardiac: Denies  palpitations, orthopnea, paroxysmal nocturnal dyspnea. GI: Denies abdominal pain,vomiting, diarrhea or constipation GU: Denies dysuria, frequency, hesitancy or incontinence MS: Denies muscle aches, joint pain or swelling Neuro: Denies headache, neurologic deficits (focal weakness, numbness, tingling), abnormal gait Psych: Denies anxiety, depression, SI/HI/AVH Skin: Denies new rashes or lesions ID: Denies sick contacts, exotic exposures, travel  Past Medical History:  Diagnosis Date   Cancer ([place])    Hx of skin cancer   COPD (chronic obstructive pulmonary disease) ([place])    Diabetes mellitus without complication ([place])    Hypertension    Renal disorder    kidney stones    Past Surgical History:  Procedure Laterality Date   LEFT HEART CATH AND CORONARY ANGIOGRAPHY N/A 05/24/2018   Procedure: LEFT HEART CATH AND CORONARY ANGIOGRAPHY;  Surgeon: Corey Skains, MD;  Location: [facility];  Service: Cardiovascular;  Laterality: N/A;    SOCIAL HISTORY:  reports that he has been smoking. He has never used smokeless tobacco. He reports that he does not drink alcohol or use drugs.  Allergies  Allergen Reactions   Shellfish Allergy     FAMILY HISTORY: Family History  Problem Relation Age of Onset   CAD Father    CAD Maternal Grandfather    CAD Paternal Grandfather      Prior to Admission medications   Medication Sig Start Date End Date Taking? Authorizing Provider  albuterol (PROVENTIL HFA;VENTOLIN HFA) 108 (90 Base) MCG/ACT inhaler Inhale 2 puffs into the lungs every 4 (four) hours as needed. 10/28/16  [provider]  amLODipine (NORVASC) 10 MG tablet Take 10 mg by mouth every morning. 03/18/18   [provider]  atorvastatin (LIPITOR) 20 MG tablet Take 1 tablet (20 mg total) by mouth daily. 05/25/18 05/25/19  Vaughan Basta, MD  chlorthalidone (HYGROTON) 25 MG tablet Take 12.5 mg by mouth every  morning. 05/17/18   [provider]  gabapentin (NEURONTIN) 300 MG capsule Take 2-3 capsules by mouth 3 (three) times daily. 600 mg qam 600 mg noon 900 mg qhs 05/13/18   [provider]  glipiZIDE (GLUCOTROL) 10 MG tablet Take 10 mg by mouth daily. 03/23/18   [provider]  Insulin Glargine (LANTUS) 100 UNIT/ML Solostar Pen Inject 20 Units into the skin daily. 05/25/18   Vaughan Basta, MD  Insulin Pen Needle 32G X 4 MM MISC 1 each by Does not apply route every morning. 05/25/18   Vaughan Basta, MD  Ipratropium-Albuterol (COMBIVENT) 20-100 MCG/ACT AERS respimat Inhale 1 puff into the lungs 4 (four) times daily. 06/02/17   [provider]  lisinopril (PRINIVIL,ZESTRIL) 40 MG tablet Take 40 mg by mouth daily. 04/23/18   [provider]  metFORMIN (GLUCOPHAGE) 1000 MG tablet Take 1,000 mg by mouth 2 (two) times daily with a meal. 05/04/18   [provider]  omeprazole (PRILOSEC) 20 MG capsule Take 1 capsule by mouth daily. 04/30/18   [provider]  sertraline (ZOLOFT) 100 MG tablet Take 50 mg by mouth daily. 05/17/18   [provider]  sucralfate (CARAFATE) 1 GM/10ML suspension Take 10 mLs (1 g total) by mouth 4 (four) times daily -  with meals and at bedtime. 05/25/18   Vaughan Basta, MD    Physical Exam: Vitals:   07/08/19 0615 07/08/19 0645 07/08/19 0700 07/08/19 0840  BP: (!) 175/99 (!) 132/91 (!) 137/95 113/77  Pulse: 93 85 85 84  Resp: (!) 22 (!) 22 (!) 21 (!) 21  Temp:      TempSrc:      SpO2: 95% 95% 95% 93%  Weight:      Height:          Constitutional: NAD, calm, comfortable Eyes: PERRL, lids and conjunctivae normal ENMT: Mucous membranes are moist. Posterior pharynx clear of any exudate or lesions.Normal dentition.  Neck: normal, supple, no masses, no thyromegaly Respiratory: clear to auscultation bilaterally, no wheezing, no crackles. Normal respiratory effort. No accessory muscle  use.  Cardiovascular: Regular rate and rhythm, no murmurs / rubs / gallops. No extremity edema. 2+ pedal pulses. No carotid bruits.  Abdomen: no tenderness, no masses palpated. No hepatosplenomegaly. Bowel sounds positive.  Musculoskeletal: no clubbing / cyanosis. No joint deformity upper and lower extremities. Good ROM, no contractures. Normal muscle tone.  Skin: no rashes, lesions, ulcers. No induration Neurologic: CN 2-12 grossly intact. Sensation intact, DTR normal. Strength 5/5 in all 4.  Psychiatric: Normal judgment and insight. Alert and oriented x 3. Normal mood.     Labs on Admission: I have personally reviewed following labs and imaging studies  CBC: Recent Labs  Lab 07/08/19 0513  WBC 8.3  HGB 17.4*  HCT 49.2  MCV 89.5  PLT 0000000   Basic Metabolic Panel: Recent Labs  Lab 07/08/19 0513  NA 140  K 4.2  CL 105  CO2 22  GLUCOSE 260*  BUN 12  CREATININE 1.05  CALCIUM 9.0   GFR: Estimated Creatinine Clearance: 96.9 mL/min (by C-G formula based on SCr of 1.05 mg/dL). Liver Function Tests: Recent Labs  Lab 07/08/19  0513  AST 34  ALT 28  ALKPHOS 104  BILITOT 1.4*  PROT 7.4  ALBUMIN 4.2   Recent Labs  Lab 07/08/19 0513  LIPASE 30   No results for input(s): AMMONIA in the last 168 hours. Coagulation Profile: Recent Labs  Lab 07/08/19 0518  INR 0.9   Cardiac Enzymes: No results for input(s): CKTOTAL, CKMB, CKMBINDEX, TROPONINI in the last 168 hours. BNP (last 3 results) No results for input(s): PROBNP in the last 8760 hours. HbA1C: No results for input(s): HGBA1C in the last 72 hours. CBG: No results for input(s): GLUCAP in the last 168 hours. Lipid Profile: No results for input(s): CHOL, HDL, LDLCALC, TRIG, CHOLHDL, LDLDIRECT in the last 72 hours. Thyroid Function Tests: No results for input(s): TSH, T4TOTAL, FREET4, T3FREE, THYROIDAB in the last 72 hours. Anemia Panel: No results for input(s): VITAMINB12, FOLATE, FERRITIN, TIBC, IRON, RETICCTPCT  in the last 72 hours. Urine analysis: No results found for: COLORURINE, APPEARANCEUR, LABSPEC, PHURINE, GLUCOSEU, HGBUR, BILIRUBINUR, KETONESUR, PROTEINUR, UROBILINOGEN, NITRITE, LEUKOCYTESUR Sepsis Labs: !!!!!!!!!!!!!!!!!!!!!!!!!!!!!!!!!!!!!!!!!!!! @LABRCNTIP (procalcitonin:4,lacticidven:4) ) Recent Results (from the past 240 hour(s))  SARS Coronavirus 2 by RT PCR (hospital order, performed in [facility] hospital lab) Nasopharyngeal Nasopharyngeal Swab     Status: None   Collection Time: 07/08/19  5:18 AM   Specimen: Nasopharyngeal Swab  Result Value Ref Range Status   SARS Coronavirus 2 NEGATIVE NEGATIVE Final    Comment: (NOTE) SARS-CoV-2 target nucleic acids are NOT DETECTED. The SARS-CoV-2 RNA is generally detectable in upper and lower respiratory specimens during the acute phase of infection. The lowest concentration of SARS-CoV-2 viral copies this assay can detect is 250 copies / mL. A negative result does not preclude SARS-CoV-2 infection and should not be used as the sole basis for treatment or other patient management decisions.  A negative result may occur with improper specimen collection / handling, submission of specimen other than nasopharyngeal swab, presence of viral mutation(s) within the areas targeted by this assay, and inadequate number of viral copies (<250 copies / mL). A negative result must be combined with clinical observations, patient history, and epidemiological information. Fact Sheet for Patients:   StrictlyIdeas.no Fact Sheet for Healthcare Providers: BankingDealers.co.za This test is not yet approved or cleared  by the [place] FDA and has been authorized for detection and/or diagnosis of SARS-CoV-2 by FDA under an Emergency Use Authorization (EUA).  This EUA will remain in effect (meaning this test can be used) for the duration of the COVID-19 declaration under Section 564(b)(1) of the Act, 21  U.S.C. section 360bbb-3(b)(1), unless the authorization is terminated or revoked sooner. Performed at [facility], [address] [place], [place] [address]      Radiological Exams on Admission: Dg Chest Portable 1 View  Result Date: 07/08/2019 CLINICAL DATA:  Chest pain. EXAM: PORTABLE CHEST 1 VIEW COMPARISON:  05/24/2018. FINDINGS: Mediastinum hilar structures normal. Heart size normal. No pulmonary venous congestion. No focal infiltrate. No pleural effusion or pneumothorax. IMPRESSION: No acute cardiopulmonary disease.  Exam stable from prior exam. Electronically Signed   By: Grady   On: 07/08/2019 05:45   Ct Angio Chest/abd/pel For Dissection W And/or W/wo  Result Date: 07/08/2019 CLINICAL DATA:  Chest pain began at 2200 hours last evening and has progressed. Acute aortic disease suspected. EXAM: CT ANGIOGRAPHY CHEST, ABDOMEN AND PELVIS TECHNIQUE: Multidetector CT imaging through the chest, abdomen and pelvis was performed using the standard protocol during bolus administration of intravenous contrast. Multiplanar reconstructed images and MIPs  were obtained and reviewed to evaluate the vascular anatomy. CONTRAST:  118mL OMNIPAQUE IOHEXOL 350 MG/ML SOLN COMPARISON:  One-view chest x-ray 07/08/2019 FINDINGS: CTA CHEST FINDINGS Cardiovascular: The heart size is normal. No significant coronary artery calcifications are present. Aorta and great vessel origins are within normal limits. Pulmonary arteries are within normal limits. Focal filling defects are present to suggest pulmonary embolus. Mediastinum/Nodes: Subcentimeter paratracheal lymph nodes are likely reactive. No significant mediastinal, hilar, or axillary adenopathy is present. Esophagus is normal. Thoracic inlet is within normal limits. Lungs/Pleura: The lungs are clear without focal nodule, mass, or airspace disease. No significant pleural effusion or pneumothorax is present. Musculoskeletal: Vertebral body heights  and alignment are maintained. No focal lytic or blastic lesions are present. Review of the MIP images confirms the above findings. CTA ABDOMEN AND PELVIS FINDINGS VASCULAR Aorta: Atherosclerotic irregularity is present, abdominal along the posterior wall of the infrarenal aorta to the bifurcation without aneurysm or focal stenosis. Celiac: Patent without evidence of aneurysm, dissection, vasculitis or significant stenosis. SMA: Patent without evidence of aneurysm, dissection, vasculitis or significant stenosis. Renals: Both renal arteries are patent without evidence of aneurysm, dissection, vasculitis, fibromuscular dysplasia or significant stenosis. IMA: Patent without evidence of aneurysm, dissection, vasculitis or significant stenosis. Inflow: Atherosclerotic irregularity is present through the bifurcation into the proximal common iliac arteries without significant stenosis or aneurysm. Veins: No obvious venous abnormality within the limitations of this arterial phase study. Review of the MIP images confirms the above findings. NON-VASCULAR Hepatobiliary: No focal liver abnormality is seen. No gallstones, gallbladder wall thickening, or biliary dilatation. Pancreas: Unremarkable. No pancreatic ductal dilatation or surrounding inflammatory changes. Spleen: Normal in size without focal abnormality. Adrenals/Urinary Tract: Adrenal glands are normal bilaterally. A 17 mm nonobstructing stone is present at the upper pole of the left kidney. No other significant nephrolithiasis is present. There is no renal obstruction. Ureters are within normal limits bilaterally. The urinary bladder is normal. Stomach/Bowel: Stomach and duodenum are within normal limits. Small bowel is unremarkable. Terminal ileum is normal. Appendix is visualized and normal. Ascending and transverse colon are within normal limits. The descending and sigmoid colon are normal. Lymphatic: No significant retroperitoneal adenopathy is present.  Reproductive: Prostate is unremarkable. Other: No abdominal wall hernia or abnormality. No abdominopelvic ascites. Musculoskeletal: No acute or significant osseous findings. Review of the MIP images confirms the above findings. IMPRESSION: 1. No aortic dissection or aneurysm. 2. No other significant vascular disease. 3. 17 mm nonobstructing stone at the upper pole of the left kidney. 4. No other acute or focal lesion to explain the patient's chest pain. 5. Aortic Atherosclerosis (ICD10-I70.0). Electronically Signed   By: San Morelle M.D.   On: 07/08/2019 06:40     All images have been reviewed by me personally.  EKG: Independently reviewed.  No acute ST-T changes  Assessment/Plan Principal Problem:   Malignant hypertensive urgency Active Problems:   Chest pain   DM2 (diabetes mellitus, type 2) (HCC)   COPD with chronic bronchitis (HCC)   Tobacco use   Benign essential HTN   Left renal stone   Hypertensive urgency, malignant    Atypical chest pain Hypertensive urgency with chest pain, uncontrolled hypertension -Admit to the stepdown unit.  CTA is negative for any dissection.  Initial systolic blood pressure into 140s, now improved.  Heart rate in 70s.  Initially started on esmolol and nicardipine drip, advised nursing staff to discontinue esmolol drip.  Slowly will wean off nicardipine drip as we administer his oral home  meds. -Initial troponin 115 -> 309; trend cardiac enzymes, check TSH, A1c and lipid panel -Cardiology consulted, on heparin drip -Had left heart catheterization in 2019-showed EF 55% with normal coronaries.  Echo was normal as well at that time. -UDS-negative  17 mm left-sided nonobstructing renal stone -Flomax, IV fluids.  Urology consulted -Check UA  Elevated total bilirubin -Very mildly elevated.  We will continue to monitor this.  Active tobacco use -Advised to quit smoking.  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  Insulin sliding scale and Accu-Chek -P.o. regimen on hold -Gabapentin 900 mg at bedtime  Hyperlipidemia -Lipitor 20 mg daily.  Check lipid panel  Depression -Zoloft  GERD -PPI and Carafate   DVT prophylaxis: Heparin drip Code Status: Full code Family Communication: Significant other at bedside Disposition Plan: To be determined Consults called: Cardiology consulted by ED, urology by me Admission status: Admission to stepdown unit as patient requires IV nicardipine drip   Time Spent: 65 minutes.  >50% of the time was devoted to discussing the patients care, assessment, plan and disposition with other care givers along with counseling the patient about the risks and benefits of treatment.    Kelsye Loomer Arsenio Loader MD [facility]  If 7PM-7AM, please contact night-coverage   07/08/2019, 9:10 AM

## 2019-07-08 NOTE — ED Notes (Signed)
Pt given meal tray, not eating at this time.

## 2019-07-08 NOTE — ED Notes (Signed)
Patient transported to CT 

## 2019-07-08 NOTE — Consult Note (Signed)
ANTICOAGULATION CONSULT NOTE - Initial Consult  Pharmacy Consult for Heparin Drip Indication: chest pain/ACS/STEMI  Allergies  Allergen Reactions  . Shellfish Allergy Hives  . Statins Other (See Comments)    Intolerance noted 10/15/08- frequent cramps    Patient Measurements: Height: 6' (182.9 cm) Weight: 234 lb 8 oz (106.4 kg) IBW/kg (Calculated) : 77.6 Heparin Dosing Weight: 99.2kg  Vital Signs: Temp: 98.7 F (37.1 C) (11/30 2159) Temp Source: Oral (11/30 2159) BP: 139/85 (11/30 2159) Pulse Rate: 104 (11/30 2159)  Labs: Recent Labs    07/08/19 0513 07/08/19 0518 07/08/19 0736 07/08/19 1416 07/08/19 2227  HGB 17.4*  --   --   --   --   HCT 49.2  --   --   --   --   PLT 160  --   --   --   --   APTT  --  30  --   --   --   LABPROT  --  11.5  --   --   --   INR  --  0.9  --   --   --   HEPARINUNFRC  --   --   --  0.11* 0.25*  CREATININE 1.05  --   --   --   --   TROPONINIHS 115*  --  309*  --   --     Estimated Creatinine Clearance: 97.8 mL/min (by C-G formula based on SCr of 1.05 mg/dL).   Medical History: Past Medical History:  Diagnosis Date  . Cancer (HCC)    Hx of skin cancer  . COPD (chronic obstructive pulmonary disease) ([place])   . Diabetes mellitus without complication ([place])   . Hypertension   . Renal disorder    kidney stones    Medications:  No PTA anticoagulation of record - pt currently on esmolol infusion and nicardipine drip.   Assessment: 58 yo male complaining of left side chest pain radiating down left arm and into his jaw that started about 22:00 last night.  Troponin 115 ng/L, now with diaphoresis and vomiting, pharmacy has been consulted for a heparin drip for ACS/STEMI.      11/30 0734 heparin via bolus 4000 units, followed by 1350 units/hr 11/30 1416 HL 0.11 - subtherapeutic (verified no interruptions in therapy with nursing)  Goal of Therapy:  Heparin level 0.3-0.7 units/ml Monitor platelets by anticoagulation protocol: Yes    Plan:  Will re-bolus 3000 units, and increase rate to 1700 units/hr.    Will follow heparin level (HL) in 6 hours per protocol.    Daily CBC's per protocol while on heparin drip.  11/30:  HL @ 2227 = 0.25 Will order Heparin 1500 units IV X 1 bolus and increase drip rate to 1900 units/hr. Will recheck HL 6 hrs after rate change.   Orene Desanctis, PharmD Clinical Pharmacist 07/08/2019 11:12 PM

## 2019-07-08 NOTE — ED Notes (Signed)
Pt currently denies chest pain. Resting with eyes closed. Wife at bedside. Unlabored. Esmolol and cardene gtts verified

## 2019-07-08 NOTE — ED Notes (Signed)
Pt now eating spaghetti he had left over from wife bringing in 14 to him.

## 2019-07-09 DIAGNOSIS — I160 Hypertensive urgency: Secondary | ICD-10-CM

## 2019-07-09 LAB — COMPREHENSIVE METABOLIC PANEL
ALT: 22 U/L (ref 0–44)
AST: 30 U/L (ref 15–41)
Albumin: 3.4 g/dL — ABNORMAL LOW (ref 3.5–5.0)
Alkaline Phosphatase: 67 U/L (ref 38–126)
Anion gap: 7 (ref 5–15)
BUN: 13 mg/dL (ref 6–20)
CO2: 25 mmol/L (ref 22–32)
Calcium: 8.2 mg/dL — ABNORMAL LOW (ref 8.9–10.3)
Chloride: 108 mmol/L (ref 98–111)
Creatinine, Ser: 0.98 mg/dL (ref 0.61–1.24)
GFR calc Af Amer: >60 mL/min (ref >60)
GFR calc non Af Amer: >60 mL/min (ref >60)
Glucose, Bld: 230 mg/dL — ABNORMAL HIGH (ref 70–99)
Potassium: 3.6 mmol/L (ref 3.5–5.1)
Sodium: 140 mmol/L (ref 135–145)
Total Bilirubin: 0.7 mg/dL (ref 0.3–1.2)
Total Protein: 6.1 g/dL — ABNORMAL LOW (ref 6.5–8.1)

## 2019-07-09 LAB — HEPARIN LEVEL (UNFRACTIONATED)
Heparin Unfractionated: 0.54 IU/mL (ref 0.30–0.70)
Heparin Unfractionated: 0.45 IU/mL (ref 0.30–0.70)

## 2019-07-09 LAB — CBC
HCT: 40.7 % (ref 39.0–52.0)
Hemoglobin: 13.7 g/dL (ref 13.0–17.0)
MCH: 30.6 pg (ref 26.0–34.0)
MCHC: 33.7 g/dL (ref 30.0–36.0)
MCV: 91.1 fL (ref 80.0–100.0)
Platelets: 123 10*3/uL — ABNORMAL LOW (ref 150–400)
RBC: 4.47 MIL/uL (ref 4.22–5.81)
RDW: 12.8 % (ref 11.5–15.5)
WBC: 7 10*3/uL (ref 4.0–10.5)
nRBC: 0 % (ref 0.0–0.2)

## 2019-07-09 LAB — APTT: aPTT: 114 seconds — ABNORMAL HIGH (ref 24–36)

## 2019-07-09 LAB — GLUCOSE, CAPILLARY
Glucose-Capillary: 179 mg/dL — ABNORMAL HIGH (ref 70–99)
Glucose-Capillary: 185 mg/dL — ABNORMAL HIGH (ref 70–99)

## 2019-07-09 LAB — PROTIME-INR
INR: 1.1 (ref 0.8–1.2)
Prothrombin Time: 14.1 seconds (ref 11.4–15.2)

## 2019-07-09 MED ORDER — TAMSULOSIN HCL 0.4 MG PO CAPS: 0.4000 mg | ORAL_CAPSULE | Freq: Every day | ORAL | 0 refills | Status: AC

## 2019-07-09 MED ORDER — LISINOPRIL 40 MG PO TABS: 40.0000 mg | ORAL_TABLET | Freq: Every day | ORAL | 0 refills | Status: AC

## 2019-07-09 MED ORDER — IPRATROPIUM-ALBUTEROL 0.5-2.5 (3) MG/3ML IN SOLN
3.0000 mL | Freq: Three times a day (TID) | RESPIRATORY_TRACT | Status: DC
  Filled 2019-07-09: qty 3

## 2019-07-09 MED ORDER — AMLODIPINE BESYLATE 10 MG PO TABS: 10.0000 mg | ORAL_TABLET | ORAL | 0 refills | Status: AC

## 2019-07-09 NOTE — Consult Note (Signed)
ANTICOAGULATION CONSULT NOTE - Initial Consult  Pharmacy Consult for Heparin Drip Indication: chest pain/ACS/STEMI  Allergies  Allergen Reactions  . Shellfish Allergy Hives  . Statins Other (See Comments)    Intolerance noted 10/15/08- frequent cramps    Patient Measurements: Height: 6' (182.9 cm) Weight: 234 lb (106.1 kg) IBW/kg (Calculated) : 77.6 Heparin Dosing Weight: 99.2kg  Vital Signs: Temp: 98.7 F (37.1 C) (12/01 0340) Temp Source: Oral (12/01 0340) BP: 144/77 (12/01 0340) Pulse Rate: 106 (12/01 0340)  Labs: Recent Labs    07/08/19 0513 07/08/19 0518 07/08/19 0736 07/08/19 1416 07/08/19 2227 07/09/19 0506  HGB 17.4*  --   --   --   --  13.7  HCT 49.2  --   --   --   --  40.7  PLT 160  --   --   --   --  123*  APTT  --  30  --   --   --   --   LABPROT  --  11.5  --   --   --   --   INR  --  0.9  --   --   --   --   HEPARINUNFRC  --   --   --  0.11* 0.25* 0.54  CREATININE 1.05  --   --   --   --  0.98  TROPONINIHS 115*  --  309*  --   --   --     Estimated Creatinine Clearance: 104.7 mL/min (by C-G formula based on SCr of 0.98 mg/dL).   Medical History: Past Medical History:  Diagnosis Date  . Cancer (HCC)    Hx of skin cancer  . COPD (chronic obstructive pulmonary disease) ([place])   . Diabetes mellitus without complication ([place])   . Hypertension   . Renal disorder    kidney stones    Medications:  No PTA anticoagulation of record - pt currently on esmolol infusion and nicardipine drip.   Assessment: 58 yo male complaining of left side chest pain radiating down left arm and into his jaw that started about 22:00 last night.  Troponin 115 ng/L, now with diaphoresis and vomiting, pharmacy has been consulted for a heparin drip for ACS/STEMI.      11/30 0734 heparin via bolus 4000 units, followed by 1350 units/hr 11/30 1416 HL 0.11 - subtherapeutic (verified no interruptions in therapy with nursing) 11/30 @ 2227 HL: 0.25. Infusion increased to 1900  units/hr   Goal of Therapy:  Heparin level 0.3-0.7 units/ml Monitor platelets by anticoagulation protocol: Yes   Plan:  12/1 @ 0506 HL: 0.54. Level is therapeutic x1. Will continue infusion rate of 1900 units/hr.  Will recheck confirmatory HL in 6 hours.   Continue to monitor CBCs daily protocol.  Pernell Dupre, PharmD, BCPS Clinical Pharmacist 07/09/2019 6:19 AM

## 2019-07-09 NOTE — Discharge Summary (Signed)
Brandon Johnston [id number] DOB: 12-Apr-1961 DOA: 07/08/2019  PCP: System, Pcp Not In  Admit date: 07/08/2019 Discharge date: 07/09/2019  Admitted From: Home Disposition: Home  Recommendations for Outpatient Follow-up:  1. Follow up with PCP in 1 week 2. Please obtain BMP/CBC in one week 3. Please follow up on the following pending results: None 4. Follow with cardiology in 1 week 5. Follow-up with urology in 1 week  Home Health: None   Discharge Condition:Stable CODE STATUS: Full Diet recommendation: Heart Healthy  Brief/Interim Summary: Brandon Johnston is a 58 y.o. male with medical history significant of tobacco use, essential hypertension, hyperlipidemia, DM 2, COPD presents to the hospital with complains of chest pain.  He has had previous symptoms like this in the past for which he had left heart catheterization October 2019 which showed normal coronaries and normal echocardiogram. He continues to smoke 1 pack of cigarettes daily, denies any alcohol and illicit drug use.In the ER initially he was noted to have systolic blood pressure greater than 240, initial troponin around 100 which trended up to 300.  EKG was unremarkable.  CTA chest was done to rule out dissection which was negative but it showed 17 mm nonobstructive left-sided renal stone. Cardiology and urology was consulted. He was transitioned to lisinopril 40 mg daily and amlodipine 10 daily. He was instructed to follow-up with cardiology and urology in 1 week . He was started on Flomax .  His blood pressure is better controlled today.  He denies any chest pain or shortness of breath.  Cudiology was okay with him being discharged home.  Discharge Diagnoses:  Principal Problem:   Malignant hypertensive urgency Active Problems:   Chest pain   DM2 (diabetes mellitus, type 2) (HCC)   COPD with chronic bronchitis (HCC)   Tobacco use   Benign essential HTN   Left renal stone   Hypertensive urgency,  malignant    Discharge Instructions  Discharge Instructions    Call MD for:  difficulty breathing, headache or visual disturbances   Complete by: As directed    Diet - low sodium heart healthy   Complete by: As directed    Discharge instructions   Complete by: As directed    Follow up with cardiology Dr. Thomes Cake in one week, unless has a primary cardiologist Follow up with pcp within one week Need bmp in one week   Increase activity slowly   Complete by: As directed      Allergies as of 07/09/2019      Reactions   Shellfish Allergy Hives   Statins Other (See Comments)   Intolerance noted 10/15/08- frequent cramps      Medication List    TAKE these medications   albuterol 108 (90 Base) MCG/ACT inhaler Commonly known as: VENTOLIN HFA Inhale 2 puffs into the lungs every 4 (four) hours as needed.   amLODipine 10 MG tablet Commonly known as: NORVASC Take 1 tablet (10 mg total) by mouth every morning.   gabapentin 300 MG capsule Commonly known as: NEURONTIN Take 2-3 capsules by mouth 3 (three) times daily. 600 mg qam 600 mg noon 900 mg qhs   Insulin Glargine 100 UNIT/ML Solostar Pen Commonly known as: LANTUS Inject 20 Units into the skin daily.   Insulin Pen Needle 32G X 4 MM Misc 1 each by Does not apply route every morning.   Ipratropium-Albuterol 20-100 MCG/ACT Aers respimat Commonly known as: COMBIVENT Inhale 1 puff into the lungs 4 (four) times daily.  lisinopril 40 MG tablet Commonly known as: ZESTRIL Take 1 tablet (40 mg total) by mouth daily. Start taking on: July 10, 2019 What changed:   medication strength  how much to take   metFORMIN 1000 MG tablet Commonly known as: GLUCOPHAGE Take 1,000 mg by mouth 2 (two) times daily with a meal.   omeprazole 20 MG capsule Commonly known as: PRILOSEC Take 1 capsule by mouth daily.   tamsulosin 0.4 MG Caps capsule Commonly known as: FLOMAX Take 1 capsule (0.4 mg total) by mouth daily after  breakfast. Start taking on: July 10, 2019       Allergies  Allergen Reactions  . Shellfish Allergy Hives  . Statins Other (See Comments)    Intolerance noted 10/15/08- frequent cramps    Consultations:  Cardiology urology   Procedures/Studies: Dg Chest Portable 1 View  Result Date: 07/08/2019 CLINICAL DATA:  Chest pain. EXAM: PORTABLE CHEST 1 VIEW COMPARISON:  05/24/2018. FINDINGS: Mediastinum hilar structures normal. Heart size normal. No pulmonary venous congestion. No focal infiltrate. No pleural effusion or pneumothorax. IMPRESSION: No acute cardiopulmonary disease.  Exam stable from prior exam. Electronically Signed   By: [place]   On: 07/08/2019 05:45   Ct Angio Chest/abd/pel For Dissection W And/or W/wo  Result Date: 07/08/2019 CLINICAL DATA:  Chest pain began at 2200 hours last evening and has progressed. Acute aortic disease suspected. EXAM: CT ANGIOGRAPHY CHEST, ABDOMEN AND PELVIS TECHNIQUE: Multidetector CT imaging through the chest, abdomen and pelvis was performed using the standard protocol during bolus administration of intravenous contrast. Multiplanar reconstructed images and MIPs were obtained and reviewed to evaluate the vascular anatomy. CONTRAST:  174mL OMNIPAQUE IOHEXOL 350 MG/ML SOLN COMPARISON:  One-view chest x-ray 07/08/2019 FINDINGS: CTA CHEST FINDINGS Cardiovascular: The heart size is normal. No significant coronary artery calcifications are present. Aorta and great vessel origins are within normal limits. Pulmonary arteries are within normal limits. Focal filling defects are present to suggest pulmonary embolus. Mediastinum/Nodes: Subcentimeter paratracheal lymph nodes are likely reactive. No significant mediastinal, hilar, or axillary adenopathy is present. Esophagus is normal. Thoracic inlet is within normal limits. Lungs/Pleura: The lungs are clear without focal nodule, mass, or airspace disease. No significant pleural effusion or pneumothorax is  present. Musculoskeletal: Vertebral body heights and alignment are maintained. No focal lytic or blastic lesions are present. Review of the MIP images confirms the above findings. CTA ABDOMEN AND PELVIS FINDINGS VASCULAR Aorta: Atherosclerotic irregularity is present, abdominal along the posterior wall of the infrarenal aorta to the bifurcation without aneurysm or focal stenosis. Celiac: Patent without evidence of aneurysm, dissection, vasculitis or significant stenosis. SMA: Patent without evidence of aneurysm, dissection, vasculitis or significant stenosis. Renals: Both renal arteries are patent without evidence of aneurysm, dissection, vasculitis, fibromuscular dysplasia or significant stenosis. IMA: Patent without evidence of aneurysm, dissection, vasculitis or significant stenosis. Inflow: Atherosclerotic irregularity is present through the bifurcation into the proximal common iliac arteries without significant stenosis or aneurysm. Veins: No obvious venous abnormality within the limitations of this arterial phase study. Review of the MIP images confirms the above findings. NON-VASCULAR Hepatobiliary: No focal liver abnormality is seen. No gallstones, gallbladder wall thickening, or biliary dilatation. Pancreas: Unremarkable. No pancreatic ductal dilatation or surrounding inflammatory changes. Spleen: Normal in size without focal abnormality. Adrenals/Urinary Tract: Adrenal glands are normal bilaterally. A 17 mm nonobstructing stone is present at the upper pole of the left kidney. No other significant nephrolithiasis is present. There is no renal obstruction. Ureters are within normal limits bilaterally. The  urinary bladder is normal. Stomach/Bowel: Stomach and duodenum are within normal limits. Small bowel is unremarkable. Terminal ileum is normal. Appendix is visualized and normal. Ascending and transverse colon are within normal limits. The descending and sigmoid colon are normal. Lymphatic: No significant  retroperitoneal adenopathy is present. Reproductive: Prostate is unremarkable. Other: No abdominal wall hernia or abnormality. No abdominopelvic ascites. Musculoskeletal: No acute or significant osseous findings. Review of the MIP images confirms the above findings. IMPRESSION: 1. No aortic dissection or aneurysm. 2. No other significant vascular disease. 3. 17 mm nonobstructing stone at the upper pole of the left kidney. 4. No other acute or focal lesion to explain the patient's chest pain. 5. Aortic Atherosclerosis (ICD10-I70.0). Electronically Signed   By: San Morelle M.D.   On: 07/08/2019 06:40       Subjective: Patient seen and examined.  Denies any shortness of breath, chest pain, or any other symptoms.  He states he feels better.  Discharge Exam: Vitals:   07/09/19 0340 07/09/19 0750  BP: (!) 144/77 (!) 158/93  Pulse: (!) 106 87  Resp:  18  Temp: 98.7 F (37.1 C) 98.4 F (36.9 C)  SpO2: 97% 94%   Vitals:   07/08/19 2100 07/08/19 2159 07/09/19 0340 07/09/19 0750  BP:  139/85 (!) 144/77 (!) 158/93  Pulse:  (!) 104 (!) 106 87  Resp:  20  18  Temp: 99 F (37.2 C) 98.7 F (37.1 C) 98.7 F (37.1 C) 98.4 F (36.9 C)  TempSrc: Oral Oral Oral Oral  SpO2:  93% 97% 94%  Weight:  106.4 kg 106.1 kg   Height:  6' (1.829 m)      General: Pt is alert, awake, not in acute distress Cardiovascular: RRR, S1/S2 +, no rubs, no gallops Respiratory: CTA bilaterally, no wheezing, no rhonchi Abdominal: Soft, NT, ND, bowel sounds + Extremities: no edema, no cyanosis Neuro: Cranial nerve II to XII grossly intact    The results of significant diagnostics from this hospitalization (including imaging, microbiology, ancillary and laboratory) are listed below for reference.     Microbiology: Recent Results (from the past 240 hour(s))  SARS Coronavirus 2 by RT PCR (hospital order, performed in [facility] hospital lab) Nasopharyngeal Nasopharyngeal Swab     Status: None   Collection  Time: 07/08/19  5:18 AM   Specimen: Nasopharyngeal Swab  Result Value Ref Range Status   SARS Coronavirus 2 NEGATIVE NEGATIVE Final    Comment: (NOTE) SARS-CoV-2 target nucleic acids are NOT DETECTED. The SARS-CoV-2 RNA is generally detectable in upper and lower respiratory specimens during the acute phase of infection. The lowest concentration of SARS-CoV-2 viral copies this assay can detect is 250 copies / mL. A negative result does not preclude SARS-CoV-2 infection and should not be used as the sole basis for treatment or other patient management decisions.  A negative result may occur with improper specimen collection / handling, submission of specimen other than nasopharyngeal swab, presence of viral mutation(s) within the areas targeted by this assay, and inadequate number of viral copies (<250 copies / mL). A negative result must be combined with clinical observations, patient history, and epidemiological information. Fact Sheet for Patients:   StrictlyIdeas.no Fact Sheet for Healthcare Providers: BankingDealers.co.za This test is not yet approved or cleared  by the [place] FDA and has been authorized for detection and/or diagnosis of SARS-CoV-2 by FDA under an Emergency Use Authorization (EUA).  This EUA will remain in effect (meaning this test can be used) for  the duration of the COVID-19 declaration under Section 564(b)(1) of the Act, 21 U.S.C. section 360bbb-3(b)(1), unless the authorization is terminated or revoked sooner. Performed at [facility], [address] [place], [place] [address]      Labs: BNP (last 3 results) No results for input(s): BNP in the last 8760 hours. Basic Metabolic Panel: Recent Labs  Lab 07/08/19 0513 07/09/19 0506  NA 140 140  K 4.2 3.6  CL 105 108  CO2 22 25  GLUCOSE 260* 230*  BUN 12 13  CREATININE 1.05 0.98  CALCIUM 9.0 8.2*   Liver Function Tests: Recent Labs   Lab 07/08/19 0513 07/09/19 0506  AST 34 30  ALT 28 22  ALKPHOS 104 67  BILITOT 1.4* 0.7  PROT 7.4 6.1*  ALBUMIN 4.2 3.4*   Recent Labs  Lab 07/08/19 0513  LIPASE 30   No results for input(s): AMMONIA in the last 168 hours. CBC: Recent Labs  Lab 07/08/19 0513 07/09/19 0506  WBC 8.3 7.0  HGB 17.4* 13.7  HCT 49.2 40.7  MCV 89.5 91.1  PLT 160 123*   Cardiac Enzymes: No results for input(s): CKTOTAL, CKMB, CKMBINDEX, TROPONINI in the last 168 hours. BNP: Invalid input(s): POCBNP CBG: Recent Labs  Lab 07/08/19 1106 07/08/19 1239 07/08/19 1708 07/08/19 2222 07/09/19 0748  GLUCAP 194* 154* 297* 200* 179*   D-Dimer No results for input(s): DDIMER in the last 72 hours. Hgb A1c Recent Labs    07/08/19 0512  HGBA1C 7.6*   Lipid Profile Recent Labs    07/08/19 0512  CHOL 227*  HDL 25*  LDLCALC UNABLE TO CALCULATE IF TRIGLYCERIDE OVER 400 mg/dL  TRIG 799*  CHOLHDL 9.1  LDLDIRECT 102.8*   Thyroid function studies Recent Labs    07/08/19 0512  TSH 1.348   Anemia work up No results for input(s): VITAMINB12, FOLATE, FERRITIN, TIBC, IRON, RETICCTPCT in the last 72 hours. Urinalysis    Component Value Date/Time   COLORURINE YELLOW (A) 07/08/2019 1032   APPEARANCEUR CLEAR (A) 07/08/2019 1032   LABSPEC >1.046 (H) 07/08/2019 1032   PHURINE 5.0 07/08/2019 1032   GLUCOSEU 150 (A) 07/08/2019 1032   HGBUR NEGATIVE 07/08/2019 1032   BILIRUBINUR NEGATIVE 07/08/2019 1032   [place] 07/08/2019 1032   PROTEINUR 30 (A) 07/08/2019 1032   NITRITE NEGATIVE 07/08/2019 1032   LEUKOCYTESUR TRACE (A) 07/08/2019 1032   Sepsis Labs Invalid input(s): PROCALCITONIN,  WBC,  LACTICIDVEN Microbiology Recent Results (from the past 240 hour(s))  SARS Coronavirus 2 by RT PCR (hospital order, performed in [place] hospital lab) Nasopharyngeal Nasopharyngeal Swab     Status: None   Collection Time: 07/08/19  5:18 AM   Specimen: Nasopharyngeal Swab  Result Value  Ref Range Status   SARS Coronavirus 2 NEGATIVE NEGATIVE Final    Comment: (NOTE) SARS-CoV-2 target nucleic acids are NOT DETECTED. The SARS-CoV-2 RNA is generally detectable in upper and lower respiratory specimens during the acute phase of infection. The lowest concentration of SARS-CoV-2 viral copies this assay can detect is 250 copies / mL. A negative result does not preclude SARS-CoV-2 infection and should not be used as the sole basis for treatment or other patient management decisions.  A negative result may occur with improper specimen collection / handling, submission of specimen other than nasopharyngeal swab, presence of viral mutation(s) within the areas targeted by this assay, and inadequate number of viral copies (<250 copies / mL). A negative result must be combined with clinical observations, patient history, and  epidemiological information. Fact Sheet for Patients:   StrictlyIdeas.no Fact Sheet for Healthcare Providers: BankingDealers.co.za This test is not yet approved or cleared  by the [place] FDA and has been authorized for detection and/or diagnosis of SARS-CoV-2 by FDA under an Emergency Use Authorization (EUA).  This EUA will remain in effect (meaning this test can be used) for the duration of the COVID-19 declaration under Section 564(b)(1) of the Act, 21 U.S.C. section 360bbb-3(b)(1), unless the authorization is terminated or revoked sooner. Performed at [facility], [address] [place], [place] [address]      Time coordinating discharge: Over 30 minutes  SIGNED:   Nolberto Hanlon, MD  [facility] 07/09/2019, 10:21 AM Pager   If 7PM-7AM, please contact night-coverage [email or web address] Password [email or web address]

## 2019-07-09 NOTE — Consult Note (Signed)
I was asked to see Brandon Johnston regarding a 17 mm nonobstructing left upper pole renal calculus.  He was admitted with hypertensive urgency and chest pain.  The stone was incidentally seen on CT angio of the chest/abdomen/pelvis. It was also noted on the CT September 2020 performed at [facility].  He indicated he knows about the stone and has a urologist who he last saw approximately 3 years ago and elected surveillance.  He remains asymptomatic.  Impression: -Nonobstructing left upper pole renal calculus  Recommendation:  -We briefly discussed management options including surveillance, ureteroscopy and PCNL.  Stone density is 1200 HU and not ideal for shockwave lithotripsy.  -I recommended he follow-up with his regular urologist for monitoring but would be happy to see him in follow-up at our practice if desired.   John Giovanni, MD

## 2019-07-09 NOTE — Progress Notes (Signed)
[facility]  SUBJECTIVE:   Patient admitted for hypertensive urgency and chest pain yesterday. Upon presentation to the ER, his BP was 216/135. This was in the setting of recent discontinuation and reduction of several of his antihypertensives secondary to lightheadedness he had experienced on his previous regimen. He was only on lisinopril 5 mg daily from home prior to his admission. Blood pressure has been well controlled during admission following administration of esmolol drip. Oral regimen is now lisinopril 40 mg daily and amlodipine 10 mg daily.   He reports feeling completely well today. He has no chest pain, lightheadedness, shortness of breath, or other cardiovascular concerns. Patient is now asking to be discharged home.    Vitals:   07/08/19 2100 07/08/19 2159 07/09/19 0340 07/09/19 0750  BP:  139/85 (!) 144/77 (!) 158/93  Pulse:  (!) 104 (!) 106 87  Resp:  20  18  Temp: 99 F (37.2 C) 98.7 F (37.1 C) 98.7 F (37.1 C) 98.4 F (36.9 C)  TempSrc: Oral Oral Oral Oral  SpO2:  93% 97% 94%  Weight:  106.4 kg 106.1 kg   Height:  6' (1.829 m)       Intake/Output Summary (Last 24 hours) at 07/09/2019 [id number] Last data filed at 07/09/2019 0341 Gross per 24 hour  Intake 1689.2 ml  Output 400 ml  Net 1289.2 ml     PHYSICAL EXAM  General: Well developed, well nourished, in no acute distress HEENT:  Normocephalic and atramatic Neck:  No JVD.  Lungs: Clear bilaterally to auscultation and percussion. Heart: HRRR . Normal S1 and S2 without gallops or murmurs.  Extremities: No clubbing, cyanosis or edema.   Neuro: Alert and oriented X 3. Psych:  Good affect, responds appropriately   LABS: Basic Metabolic Panel: Recent Labs    07/08/19 0513 07/09/19 0506  NA 140 140  K 4.2 3.6  CL 105 108  CO2 22 25  GLUCOSE 260* 230*  BUN 12 13  CREATININE 1.05 0.98  CALCIUM 9.0 8.2*   Liver Function Tests: Recent Labs    07/08/19 0513 07/09/19 0506  AST 34 30  ALT 28 22   ALKPHOS 104 67  BILITOT 1.4* 0.7  PROT 7.4 6.1*  ALBUMIN 4.2 3.4*   Recent Labs    07/08/19 0513  LIPASE 30   CBC: Recent Labs    07/08/19 0513 07/09/19 0506  WBC 8.3 7.0  HGB 17.4* 13.7  HCT 49.2 40.7  MCV 89.5 91.1  PLT 160 123*   Cardiac Enzymes: No results for input(s): CKTOTAL, CKMB, CKMBINDEX, TROPONINI in the last 72 hours. BNP: Invalid input(s): POCBNP D-Dimer: No results for input(s): DDIMER in the last 72 hours. Hemoglobin A1C: Recent Labs    07/08/19 0512  HGBA1C 7.6*   Fasting Lipid Panel: Recent Labs    07/08/19 0512  CHOL 227*  HDL 25*  LDLCALC UNABLE TO CALCULATE IF TRIGLYCERIDE OVER 400 mg/dL  TRIG 799*  CHOLHDL 9.1  LDLDIRECT 102.8*   Thyroid Function Tests: Recent Labs    07/08/19 0512  TSH 1.348   Anemia Panel: No results for input(s): VITAMINB12, FOLATE, FERRITIN, TIBC, IRON, RETICCTPCT in the last 72 hours.   ASSESSMENT AND PLAN:  57 year old male with hypertension, hyperlipidemia, and tobacco abuse, presented to the ER yesterday with hypertensive urgency with BP of 230/135. Patient also had chest pressure and elevated troponin to 115 initially. Then 309 on repeat, however, this is likely secondary to demand ischemia from malignant hypertension given patient has a  history of recent history of cardiac catheterization with normal coronary arteries from 2019. Very low suspicion for ACS at this time. No evidence of dissection by CT scan. Blood pressure controlled with use of esmolol drip. Patient with resolution of chest pain after blood pressure improved. He feels completely well at this time. Blood pressure now 140/70 on oral antihypertensive regimen.   Principal Problem:   Malignant hypertensive urgency Active Problems:   Chest pain   DM2 (diabetes mellitus, type 2) ([place])   COPD with chronic bronchitis (HCC)   Tobacco use   Benign essential HTN   Left renal stone   Hypertensive urgency, malignant    Plan: 1.  Continue  antihypertensive medication management at this time without change to medication regimen 2.  Low suspicion for ACS. No indication for further cardiac diagnostics or interventions at this time 3.  Patient Is stable for discharge home today from a cardiac standpoint - medication regimen has been optimized at this time. Emphasized importance of close home BP monitoring, adherence to current medication regimen, and following the DASH diet for BP control.  4.  Close outpatient cardiology follow up in ~1 week for further medication adjustments   Hilbert Odor, PA-C 07/09/2019 9:21 AM

## 2021-05-22 IMAGING — CT CT ANGIO CHEST-ABD-PELV FOR DISSECTION W/ AND WO/W CM
2 of 7 series · 13 of 46 positions shown, 15 images · IV contrast (omnipaque)
Comparison: One-view chest x-ray 07/08/2019

CLINICAL DATA: Chest pain began at 2266 hours last evening and has
progressed. Acute aortic disease suspected.

EXAM:
CT ANGIOGRAPHY CHEST, ABDOMEN AND PELVIS
TECHNIQUE: Multidetector CT imaging through the chest, abdomen and pelvis was
performed using the standard protocol during bolus administration of
intravenous contrast. Multiplanar reconstructed images and MIPs were
obtained and reviewed to evaluate the vascular anatomy.
CONTRAST:  100mL OMNIPAQUE IOHEXOL 350 MG/ML SOLN

[Series 6: axial arterial · axial · arterial · 0.78mm/px · z∈[-1166,-544]mm · 10 of 239 slices shown, 12 images]
[im 16/239  soft-tissue]
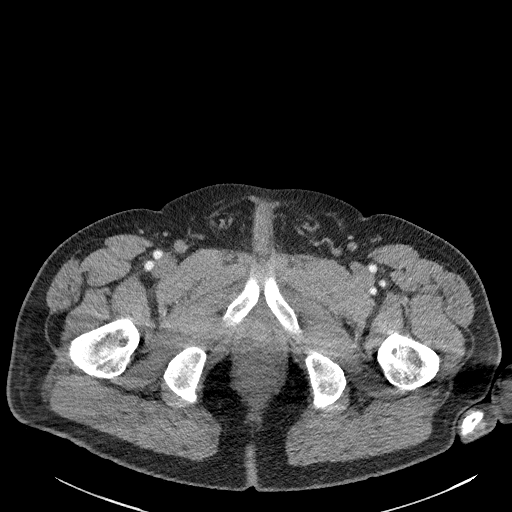
[im 16/239  bone]
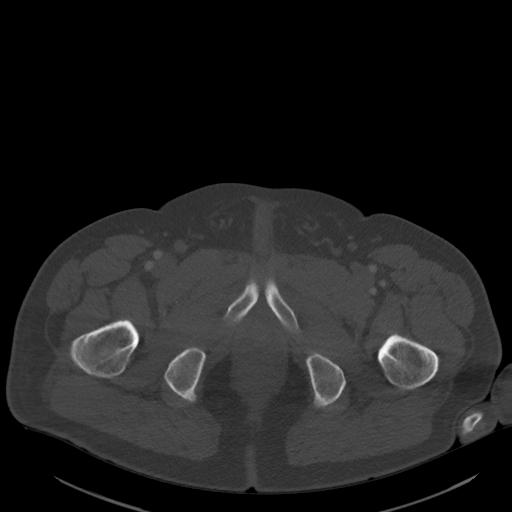
[im 48/239  soft-tissue]
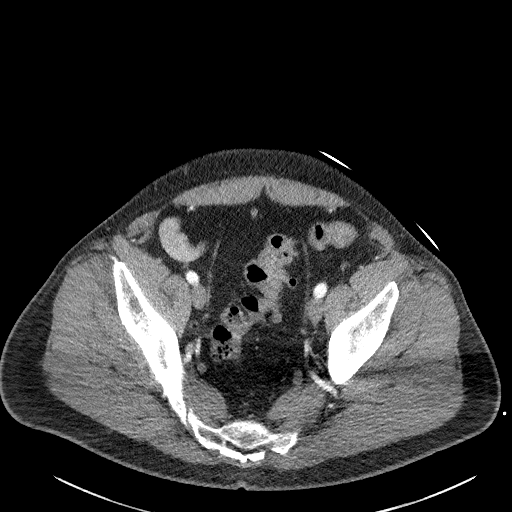
[im 64/239  soft-tissue]
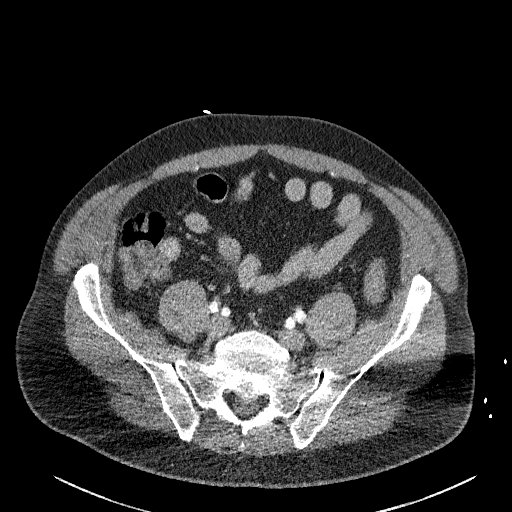
[im 80/239  soft-tissue]
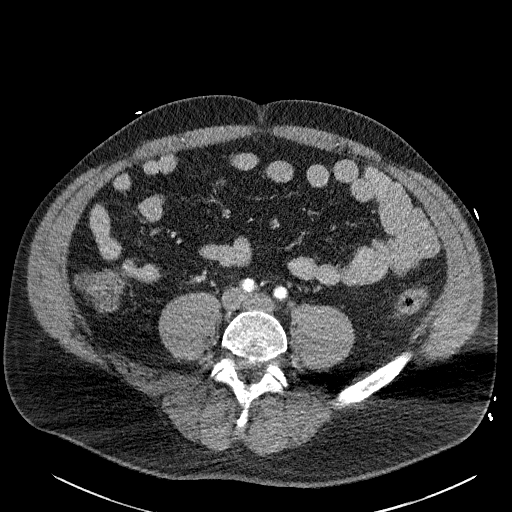
[im 112/239  soft-tissue]
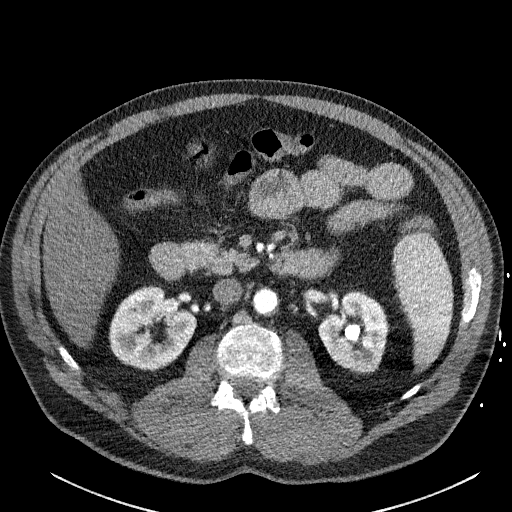
[im 127/239  soft-tissue]
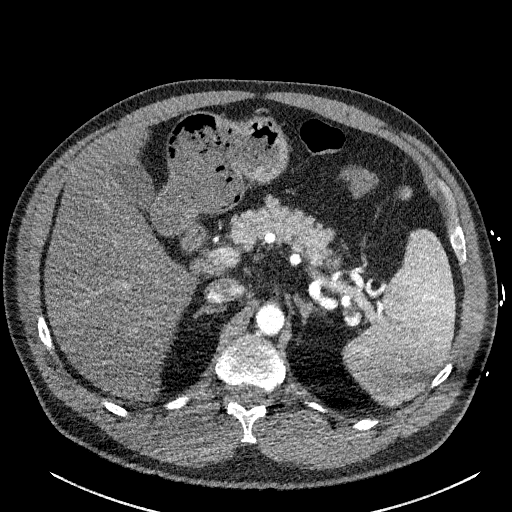
[im 159/239  soft-tissue]
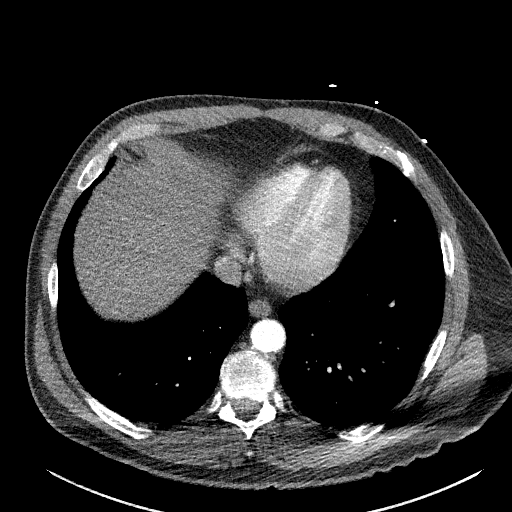
[im 175/239  soft-tissue]
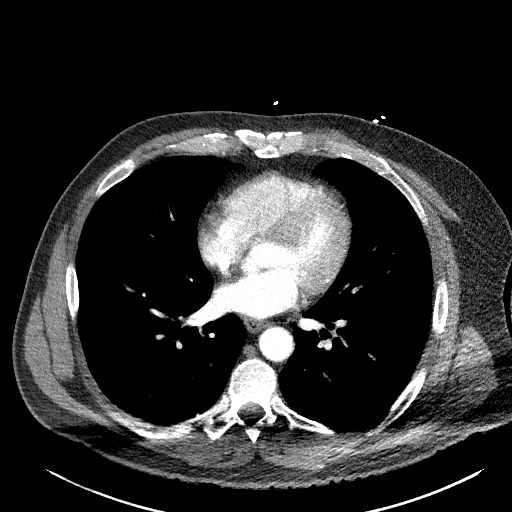
[im 191/239  soft-tissue]
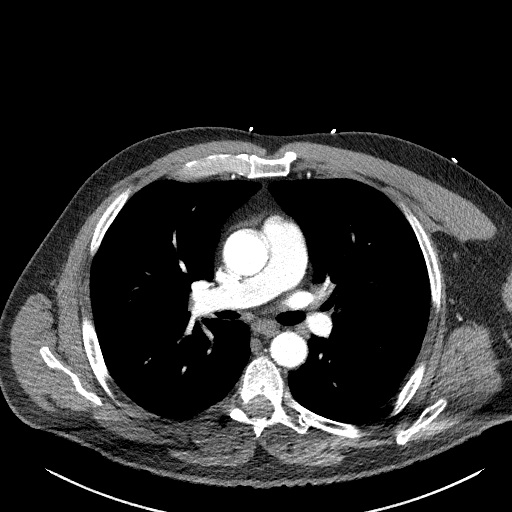
[im 191/239  bone]
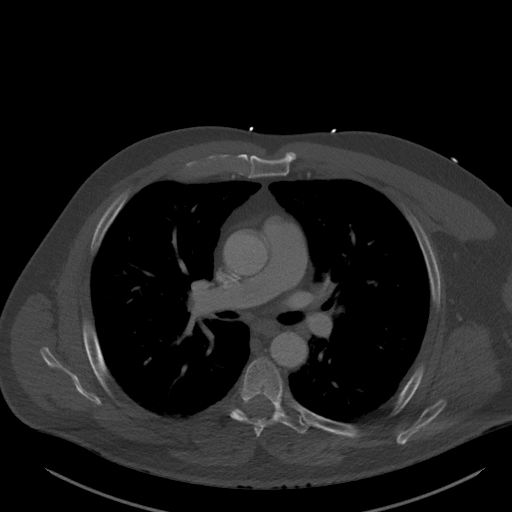
[im 223/239  soft-tissue]
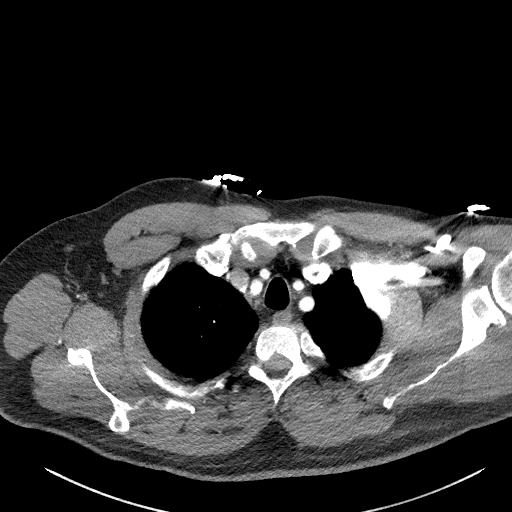

[Series 8: coronals · coronal · 0.85mm/px · 3 of 152 slices shown]
[im 38/152  soft-tissue]
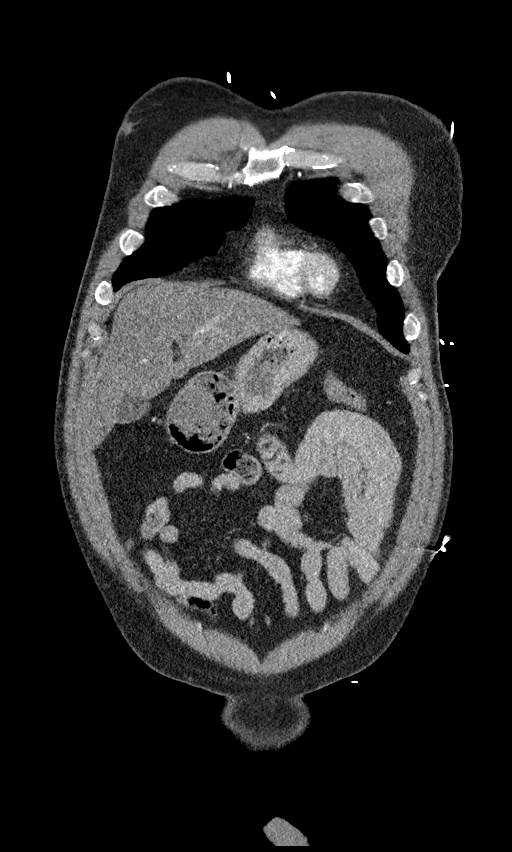
[im 76/152  soft-tissue]
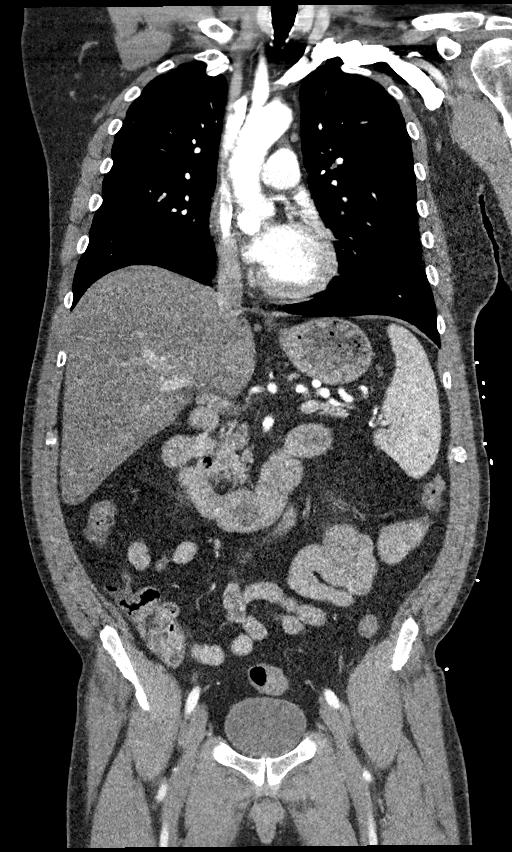
[im 114/152  soft-tissue]
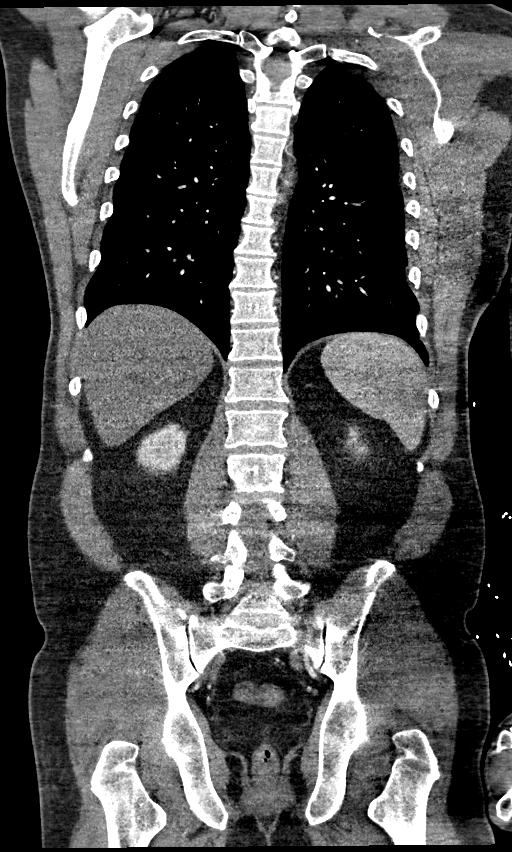

[13 of 46 positions shown; findings below may reference images not displayed]

FINDINGS: CTA CHEST FINDINGS

Cardiovascular: The heart size is normal. No significant coronary
artery calcifications are present. Aorta and great vessel origins
are within normal limits. Pulmonary arteries are within normal
limits. Focal filling defects are present to suggest pulmonary
embolus.

Mediastinum/Nodes: Subcentimeter paratracheal lymph nodes are likely
reactive. No significant mediastinal, hilar, or axillary adenopathy
is present. Esophagus is normal. Thoracic inlet is within normal
limits.

Lungs/Pleura: The lungs are clear without focal nodule, mass, or
airspace disease. No significant pleural effusion or pneumothorax is
present.

Musculoskeletal: Vertebral body heights and alignment are
maintained. No focal lytic or blastic lesions are present.

Review of the MIP images confirms the above findings.

CTA ABDOMEN AND PELVIS FINDINGS

VASCULAR

Aorta: Atherosclerotic irregularity is present, abdominal along the
posterior wall of the infrarenal aorta to the bifurcation without
aneurysm or focal stenosis.

Celiac: Patent without evidence of aneurysm, dissection, vasculitis
or significant stenosis.

SMA: Patent without evidence of aneurysm, dissection, vasculitis or
significant stenosis.

Renals: Both renal arteries are patent without evidence of aneurysm,
dissection, vasculitis, fibromuscular dysplasia or significant
stenosis.

IMA: Patent without evidence of aneurysm, dissection, vasculitis or
significant stenosis.

Inflow: Atherosclerotic irregularity is present through the
bifurcation into the proximal common iliac arteries without
significant stenosis or aneurysm.

Veins: No obvious venous abnormality within the limitations of this
arterial phase study.

Review of the MIP images confirms the above findings.

NON-VASCULAR

Hepatobiliary: No focal liver abnormality is seen. No gallstones,
gallbladder wall thickening, or biliary dilatation.

Pancreas: Unremarkable. No pancreatic ductal dilatation or
surrounding inflammatory changes.

Spleen: Normal in size without focal abnormality.

Adrenals/Urinary Tract: Adrenal glands are normal bilaterally. A 17
mm nonobstructing stone is present at the upper pole of the left
kidney. No other significant nephrolithiasis is present. There is no
renal obstruction. Ureters are within normal limits bilaterally. The
urinary bladder is normal.

Stomach/Bowel: Stomach and duodenum are within normal limits. Small
bowel is unremarkable. Terminal ileum is normal. Appendix is
visualized and normal. Ascending and transverse colon are within
normal limits. The descending and sigmoid colon are normal.

Lymphatic: No significant retroperitoneal adenopathy is present.

Reproductive: Prostate is unremarkable.

Other: No abdominal wall hernia or abnormality. No abdominopelvic
ascites.

Musculoskeletal: No acute or significant osseous findings.

Review of the MIP images confirms the above findings.
IMPRESSION: 1. No aortic dissection or aneurysm.
2. No other significant vascular disease.
3. 17 mm nonobstructing stone at the upper pole of the left kidney.
4. No other acute or focal lesion to explain the patient's chest
pain.
5. Aortic Atherosclerosis ([id number]).

## 2021-05-22 IMAGING — DX DG CHEST 1V PORT
1 series · 1 of 1 positions shown · non-contrast
Comparison: 05/24/2018.

CLINICAL DATA: Chest pain.

EXAM:
PORTABLE CHEST 1 VIEW

[chest ap]
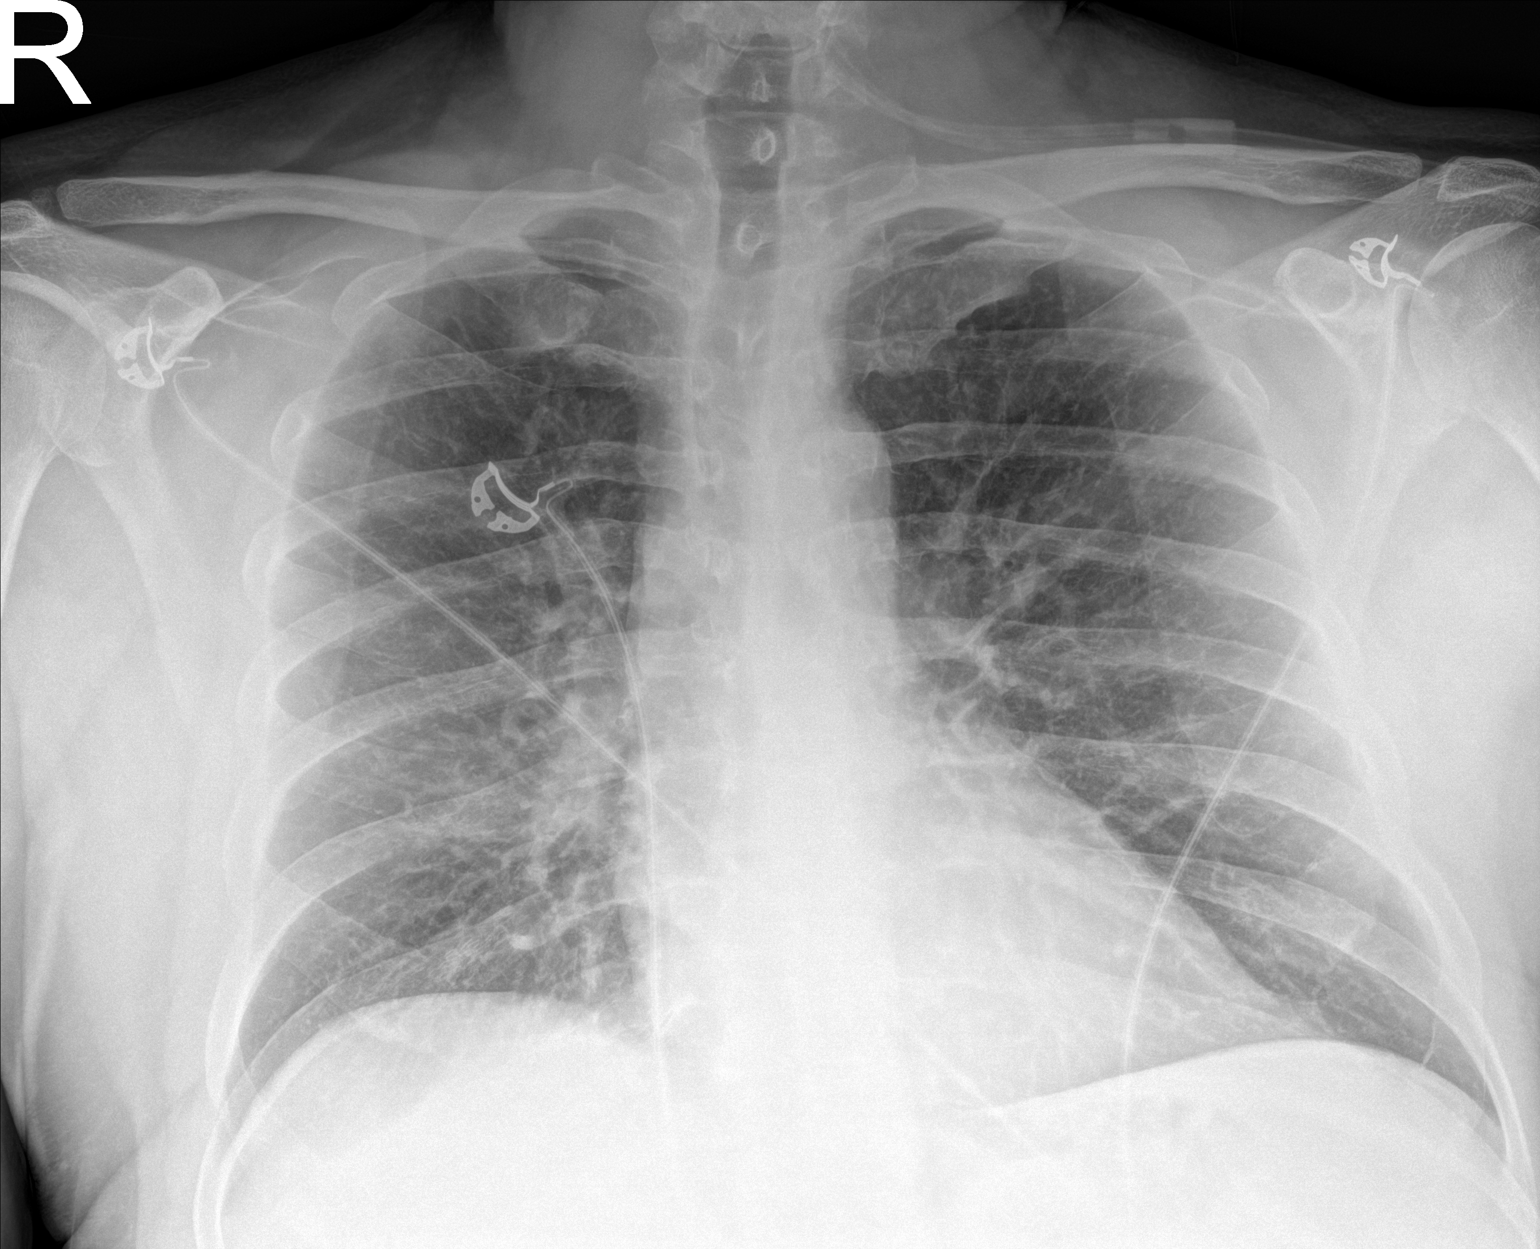

[1 of 1 positions shown; findings below may reference images not displayed]

FINDINGS: Mediastinum hilar structures normal. Heart size normal. No pulmonary
venous congestion. No focal infiltrate. No pleural effusion or
pneumothorax.
IMPRESSION: No acute cardiopulmonary disease.  Exam stable from prior exam.
